# Patient Record
Sex: Female | Born: 1965 | Race: White | Hispanic: No | Smoking: Former smoker
Health system: Southern US, Community
[De-identification: ages and names within clinical notes are randomized; demographics above are authoritative.]

---

## 2011-02-09 DIAGNOSIS — C50919 Malignant neoplasm of unspecified site of unspecified female breast: Secondary | ICD-10-CM

## 2011-02-09 HISTORY — DX: Malignant neoplasm of unspecified site of unspecified female breast: C50.919

## 2015-02-09 DIAGNOSIS — Z87891 Personal history of nicotine dependence: Secondary | ICD-10-CM

## 2015-02-09 HISTORY — DX: Personal history of nicotine dependence: Z87.891

## 2019-12-28 ENCOUNTER — Inpatient Hospital Stay (HOSPITAL_COMMUNITY)
Admission: AD | Admit: 2019-12-28 | Discharge: 2019-12-31 | DRG: 406 | Disposition: A | Discharge status: Home / Self Care | Payer: Medicaid Other | Source: Other Acute Inpatient Hospital | Attending: Internal Medicine | Admitting: Internal Medicine

## 2019-12-28 ENCOUNTER — Encounter (HOSPITAL_COMMUNITY): Payer: Self-pay | Admitting: Family Medicine

## 2019-12-28 ENCOUNTER — Other Ambulatory Visit: Payer: Self-pay

## 2019-12-28 ENCOUNTER — Observation Stay (HOSPITAL_COMMUNITY): Payer: Medicaid Other

## 2019-12-28 DIAGNOSIS — J449 Chronic obstructive pulmonary disease, unspecified: Secondary | ICD-10-CM | POA: Diagnosis not present

## 2019-12-28 DIAGNOSIS — E876 Hypokalemia: Secondary | ICD-10-CM | POA: Diagnosis not present

## 2019-12-28 DIAGNOSIS — K449 Diaphragmatic hernia without obstruction or gangrene: Secondary | ICD-10-CM | POA: Diagnosis present

## 2019-12-28 DIAGNOSIS — R7401 Elevation of levels of liver transaminase levels: Secondary | ICD-10-CM | POA: Diagnosis present

## 2019-12-28 DIAGNOSIS — K8067 Calculus of gallbladder and bile duct with acute and chronic cholecystitis with obstruction: Secondary | ICD-10-CM | POA: Diagnosis not present

## 2019-12-28 DIAGNOSIS — Z87891 Personal history of nicotine dependence: Secondary | ICD-10-CM | POA: Diagnosis not present

## 2019-12-28 DIAGNOSIS — K8050 Calculus of bile duct without cholangitis or cholecystitis without obstruction: Principal | ICD-10-CM

## 2019-12-28 DIAGNOSIS — R7989 Other specified abnormal findings of blood chemistry: Secondary | ICD-10-CM

## 2019-12-28 DIAGNOSIS — I10 Essential (primary) hypertension: Secondary | ICD-10-CM | POA: Diagnosis present

## 2019-12-28 DIAGNOSIS — K2210 Ulcer of esophagus without bleeding: Secondary | ICD-10-CM | POA: Diagnosis present

## 2019-12-28 DIAGNOSIS — K838 Other specified diseases of biliary tract: Secondary | ICD-10-CM

## 2019-12-28 DIAGNOSIS — R933 Abnormal findings on diagnostic imaging of other parts of digestive tract: Secondary | ICD-10-CM

## 2019-12-28 DIAGNOSIS — E871 Hypo-osmolality and hyponatremia: Secondary | ICD-10-CM | POA: Diagnosis not present

## 2019-12-28 DIAGNOSIS — R1319 Other dysphagia: Secondary | ICD-10-CM

## 2019-12-28 DIAGNOSIS — R1010 Upper abdominal pain, unspecified: Secondary | ICD-10-CM

## 2019-12-28 DIAGNOSIS — Z853 Personal history of malignant neoplasm of breast: Secondary | ICD-10-CM

## 2019-12-28 DIAGNOSIS — Z79899 Other long term (current) drug therapy: Secondary | ICD-10-CM

## 2019-12-28 DIAGNOSIS — K222 Esophageal obstruction: Secondary | ICD-10-CM | POA: Diagnosis present

## 2019-12-28 DIAGNOSIS — Z20822 Contact with and (suspected) exposure to covid-19: Secondary | ICD-10-CM | POA: Diagnosis present

## 2019-12-28 DIAGNOSIS — K7581 Nonalcoholic steatohepatitis (NASH): Secondary | ICD-10-CM | POA: Diagnosis not present

## 2019-12-28 DIAGNOSIS — K2100 Gastro-esophageal reflux disease with esophagitis, without bleeding: Secondary | ICD-10-CM

## 2019-12-28 DIAGNOSIS — R935 Abnormal findings on diagnostic imaging of other abdominal regions, including retroperitoneum: Secondary | ICD-10-CM

## 2019-12-28 LAB — COMPREHENSIVE METABOLIC PANEL
ALT: 161 U/L — ABNORMAL HIGH (ref 0–44)
AST: 158 U/L — ABNORMAL HIGH (ref 15–41)
Albumin: 2.6 g/dL — ABNORMAL LOW (ref 3.5–5.0)
Alkaline Phosphatase: 1239 U/L — ABNORMAL HIGH (ref 38–126)
Anion gap: 7 (ref 5–15)
BUN: 6 mg/dL (ref 6–20)
CO2: 25 mmol/L (ref 22–32)
Calcium: 8.6 mg/dL — ABNORMAL LOW (ref 8.9–10.3)
Chloride: 102 mmol/L (ref 98–111)
Creatinine, Ser: 0.41 mg/dL — ABNORMAL LOW (ref 0.44–1.00)
GFR, Estimated: >60 mL/min (ref >60)
Glucose, Bld: 99 mg/dL (ref 70–99)
Potassium: 3.1 mmol/L — ABNORMAL LOW (ref 3.5–5.1)
Sodium: 134 mmol/L — ABNORMAL LOW (ref 135–145)
Total Bilirubin: 1 mg/dL (ref 0.3–1.2)
Total Protein: 5.8 g/dL — ABNORMAL LOW (ref 6.5–8.1)

## 2019-12-28 LAB — CBC WITH DIFFERENTIAL/PLATELET
Abs Immature Granulocytes: 0.05 10*3/uL (ref 0.00–0.07)
Basophils Absolute: 0.1 10*3/uL (ref 0.0–0.1)
Basophils Relative: 1 %
Eosinophils Absolute: 0.1 10*3/uL (ref 0.0–0.5)
Eosinophils Relative: 1 %
HCT: 29.2 % — ABNORMAL LOW (ref 36.0–46.0)
Hemoglobin: 9.6 g/dL — ABNORMAL LOW (ref 12.0–15.0)
Immature Granulocytes: 1 %
Lymphocytes Relative: 10 %
Lymphs Abs: 1 10*3/uL (ref 0.7–4.0)
MCH: 32.9 pg (ref 26.0–34.0)
MCHC: 32.9 g/dL (ref 30.0–36.0)
MCV: 100 fL (ref 80.0–100.0)
Monocytes Absolute: 0.8 10*3/uL (ref 0.1–1.0)
Monocytes Relative: 9 %
Neutro Abs: 7.8 10*3/uL — ABNORMAL HIGH (ref 1.7–7.7)
Neutrophils Relative %: 78 %
Platelets: 372 10*3/uL (ref 150–400)
RBC: 2.92 MIL/uL — ABNORMAL LOW (ref 3.87–5.11)
RDW: 13.6 % (ref 11.5–15.5)
WBC: 9.8 10*3/uL (ref 4.0–10.5)
nRBC: 0 % (ref 0.0–0.2)

## 2019-12-28 LAB — LIPASE, BLOOD: Lipase: 73 U/L — ABNORMAL HIGH (ref 11–51)

## 2019-12-28 LAB — HIV ANTIBODY (ROUTINE TESTING W REFLEX): HIV Screen 4th Generation wRfx: NONREACTIVE

## 2019-12-28 LAB — MAGNESIUM: Magnesium: 1.8 mg/dL (ref 1.7–2.4)

## 2019-12-28 MED ORDER — GADOBUTROL 1 MMOL/ML IV SOLN
5.0000 mL | Freq: Once | INTRAVENOUS | Status: AC | PRN
  Administered 2019-12-28: 5 mL via INTRAVENOUS

## 2019-12-28 MED ORDER — ONDANSETRON HCL 4 MG/2ML IJ SOLN: 4.0000 mg | Freq: Three times a day (TID) | INTRAMUSCULAR | Status: DC | PRN

## 2019-12-28 MED ORDER — ENOXAPARIN SODIUM 40 MG/0.4ML ~~LOC~~ SOLN
40.0000 mg | SUBCUTANEOUS | Status: AC
  Administered 2019-12-28 – 2019-12-29 (×2): 40 mg via SUBCUTANEOUS
  Filled 2019-12-28 (×2): qty 0.4

## 2019-12-28 MED ORDER — UMECLIDINIUM BROMIDE 62.5 MCG/INH IN AEPB
1.0000 | INHALATION_SPRAY | Freq: Every day | RESPIRATORY_TRACT | Status: DC
  Filled 2019-12-28: qty 7

## 2019-12-28 MED ORDER — LACTATED RINGERS IV SOLN: INTRAVENOUS | Status: DC

## 2019-12-28 MED ORDER — LIP MEDEX EX OINT
TOPICAL_OINTMENT | CUTANEOUS | Status: AC
  Filled 2019-12-28: qty 7

## 2019-12-28 MED ORDER — HYDROMORPHONE HCL 1 MG/ML IJ SOLN
0.5000 mg | INTRAMUSCULAR | Status: DC | PRN
  Administered 2019-12-28: 0.5 mg via INTRAVENOUS
  Filled 2019-12-28: qty 0.5

## 2019-12-28 MED ORDER — HYDROMORPHONE HCL 1 MG/ML IJ SOLN
1.0000 mg | INTRAMUSCULAR | Status: DC | PRN
  Administered 2019-12-28 – 2019-12-31 (×14): 1 mg via INTRAVENOUS
  Filled 2019-12-28 (×14): qty 1

## 2019-12-28 MED ORDER — AMLODIPINE BESYLATE 5 MG PO TABS
5.0000 mg | ORAL_TABLET | Freq: Every day | ORAL | Status: DC
  Administered 2019-12-29: 5 mg via ORAL
  Filled 2019-12-28: qty 1

## 2019-12-28 MED ORDER — MOMETASONE FURO-FORMOTEROL FUM 200-5 MCG/ACT IN AERO
2.0000 | INHALATION_SPRAY | Freq: Two times a day (BID) | RESPIRATORY_TRACT | Status: DC
  Filled 2019-12-28: qty 8.8

## 2019-12-28 MED ORDER — POTASSIUM CHLORIDE CRYS ER 20 MEQ PO TBCR
20.0000 meq | EXTENDED_RELEASE_TABLET | Freq: Two times a day (BID) | ORAL | Status: AC
  Administered 2019-12-28: 20 meq via ORAL
  Filled 2019-12-28 (×2): qty 1

## 2019-12-28 MED ORDER — ALBUTEROL SULFATE (2.5 MG/3ML) 0.083% IN NEBU: 2.5000 mg | INHALATION_SOLUTION | Freq: Four times a day (QID) | RESPIRATORY_TRACT | Status: DC | PRN

## 2019-12-28 NOTE — Progress Notes (Signed)
Pt arrived to the via [place] from [facility]. [facility]. Called and updated. Per Secretary: Dr. Lara Mulch was notified and will come to evaluate patient. Awaiting orders for patient. Charge Nurse updated.   CHG bath given and patient oriented to room enviroment. Restricted arm band and name band placed, pt has history of L Breast Ca.  Call light and phone in reach. Awaiting new orders/

## 2019-12-28 NOTE — H&P (Signed)
History and Physical  Patient Name: Katrina Calhoun     ESP:[id number]    DOB: 10/23/1965    DOA: 12/28/2019 PCP: Pcp, No  Patient coming from: [facility]  Chief Complaint: Epigastric pain      HPI: Katrina Calhoun is a 54 y.o. F with hx COPD FEV1 46%, HTN who presents with 1 week progrsesive epigastric pain.  Over last week, patient has had intermittent/colicky, severe epigastric pian, associated with nausea.  Finally came to ER at [facility] where AST/ALT >300, Tbili 2.8, and CT abdomen showed dilated CBD.  Case discussed with GI here who recommended transfer for evaluation for ERCP.       ROS: Review of Systems  Constitutional: Negative for chills, fever and malaise/fatigue.  Gastrointestinal: Positive for abdominal pain and nausea.  All other systems reviewed and are negative.         Past Medical History:  Diagnosis Date  . Breast cancer ([place]) 2013   Left Breast CA  . Smoking history 02/2015   stopped smoking    Past surgical history: None pertinent.     Social History: Patient lives at home.  The patient walks unassisted.  Former smoker.  No Known Allergies  Family history:  Reviewed, no family history of gallbladder disease.  Prior to Admission medications   Medication Sig Start Date End Date Taking? Authorizing Provider  Aclidinium Bromide (TUDORZA PRESSAIR) 400 MCG/ACT AEPB Inhale 400 mcg into the lungs every 12 (twelve) hours. 12/26/18  Yes [provider]  albuterol (VENTOLIN HFA) 108 (90 Base) MCG/ACT inhaler Inhale 2 puffs into the lungs every 6 (six) hours as needed for wheezing. 12/26/18  Yes [provider]  amLODipine (NORVASC) 5 MG tablet Take 5 mg by mouth daily. 10/09/19  Yes [provider]  Fluticasone-Salmeterol (ADVAIR) 250-50 MCG/DOSE AEPB Inhale 1 puff into the lungs every 12 (twelve) hours. 12/26/18  Yes [provider]  Ipratropium-Albuterol (COMBIVENT) 20-100 MCG/ACT AERS respimat Inhale 2  puffs into the lungs every 4 (four) hours as needed for wheezing or shortness of breath.  02/14/18  Yes [provider]       Physical Exam: BP 114/84 (BP Location: Right Arm)   Pulse 80   Temp 97.9 F (36.6 C) (Oral)   Resp 18   Ht _0  (1.6 m)   Wt 52.2 kg   SpO2 99%   BMI 20.37 kg/m  General appearance: Well-developed, adult female, alert and in no acute distress.   Eyes: Anicteric, conjunctiva pink, lids and lashes normal. PERRL.    ENT: No nasal deformity, discharge, epistaxis.  Hearing normal. OP moist without lesions.   Neck: No neck masses.  Trachea midline.  No thyromegaly/tenderness. Lymph: No cervical or supraclavicular lymphadenopathy. Skin: Warm and dry.  No jaundice.  No suspicious rashes or lesions. Cardiac: RRR, nl S1-S2, no murmurs appreciated.  Capillary refill is brisk.  JVP normal.  No LE edema.  Radial pulses 2+ and symmetric. Respiratory: Normal respiratory rate and rhythm.  CTAB without rales or wheezes. Abdomen: Abdomen soft.  Moderate epigastric TTP with voluntary guarding. No ascites, distension, hepatosplenomegaly.   MSK: No deformities or effusions of the large joints of the upper or lower extremities bilaterally.  No cyanosis or clubbing. Neuro: Cranial nerves normal.  Sensation intact to light touch. Speech is fluent.  Muscle strength normal.    Psych: Sensorium intact and responding to questions, attention normal.  Behavior appropriate.  Affect normal.  Judgment and insight appear normal.  Labs on Admission:  I have personally reviewed following labs and imaging studies: CBC: Recent Labs  Lab 12/28/19 1033  WBC 9.8  NEUTROABS 7.8*  HGB 9.6*  HCT 29.2*  MCV 100.0  PLT 718   Basic Metabolic Panel: Recent Labs  Lab 12/28/19 1033  NA 134*  K 3.1*  CL 102  CO2 25  GLUCOSE 99  BUN 6  CREATININE 0.41*  CALCIUM 8.6*   GFR: Estimated Creatinine Clearance: 64.7 mL/min (A) (by C-G formula based on SCr of 0.41 mg/dL  (L)).  Liver Function Tests: Recent Labs  Lab 12/28/19 1033  AST 158*  ALT 161*  ALKPHOS 1,239*  BILITOT 1.0  PROT 5.8*  ALBUMIN 2.6*       Assessment/Plan   Transaminitis and choledocholithiasis Patient presented to OSH with AST/ALT reportedly in 300s and Tbili 2.8, down to 100s and normal respectively today but Alk phos >1000.   CT noted CBD dilation at OSH.  -NPO except ice chips -Trend LFTs and lipase  -Consult GI for advice re: MRCP and observation vs ERCP    COPD FEV1 46%, not on O2.  No active disease.  With regard to risks of sedation given respiratory disease, she is well compensated presently, has no symptoms, and is actually feeling better respiratory wise since quitting smoking 4 months ago.  She has marginally increased risk of sedation due to her COPD, but needs no further optimization prior to ERCP if needed  -Continue ICS/LABA/LAMA    Hypokalemia -Check mag -Supplement K  Hypertension  -Continue home amlodipine      DVT prophylaxis: Lovenox  Code Status: FULL  Family Communication: None  Disposition Plan: Anticipate GI evaluation and observation overnight, if no intervention planned and LFTs trend down, possibly home tomorrow. Consults called: GI Admission status: OBS   At the point of initial evaluation, it is my clinical opinion that admission for OBSERVATION is reasonable and necessary because the patient's presenting complaints in the context of their chronic conditions represent sufficient risk of deterioration or significant morbidity to constitute reasonable grounds for close observation in the hospital setting, but that the patient may be medically stable for discharge from the hospital within 24 to 48 hours.    Medical decision making: Patient seen at 8:40AM on 12/28/2019.  The patient was discussed with Dr. Cristina Gong who will disuss with [facility].  What exists of the patient's chart was reviewed in depth and summarized above.   Clinical condition: stable.        [place] [facility] Please page though [place] or Epic secure chat:  For password, contact charge nurse

## 2019-12-28 NOTE — Progress Notes (Signed)
Initial Nutrition Assessment  RD working remotely.  DOCUMENTATION CODES:   Not applicable  INTERVENTION:  - diet advancement as medically feasible. - will complete NFPE at follow-up.   NUTRITION DIAGNOSIS:   Inadequate oral intake related to acute illness as evidenced by per patient/family report.  GOAL:   Patient will meet greater than or equal to 90% of their needs  MONITOR:   Diet advancement, Labs, Weight trends  REASON FOR ASSESSMENT:   Malnutrition Screening Tool    ASSESSMENT:   54 y.o. female with medical history of COPD, HTN, and breast cancer. She presented to the ED with 1-week hx of epigastric pain which was intermittent, severe, and associated with nausea.  Patient has been NPO since admission. Weight today is documented as 115 lb, which appears to be a stated weight. The only other weight recorded in the chart was from [place] in 05/2015 when she weighed 121 lb.   Per notes: - transaminitis with choledocholithiasis - GI consulted and recommends MRI/MRCP and possible ERCP dependent on results   Labs reviewed; Na: 134 mmol/l, K: 3.1 mmol/l, creatinine: 0.41 mg/dl, Ca: 8.6 mg/dl, Alk Phos and LFTs significantly elevated.  Medications reviewed; 20 mEq Klor-Con x2 doses 11/19. IVF; LR @ 100 ml/hr.    NUTRITION - FOCUSED PHYSICAL EXAM:  unable to complete at this time.   Diet Order:   Diet Order            Diet NPO time specified Except for: Ice Chips, Sips with Meds  Diet effective now                 EDUCATION NEEDS:   No education needs have been identified at this time  Skin:  Skin Assessment: Reviewed RN Assessment  Last BM:  PTA/unknown  Height:   Ht Readings from Last 1 Encounters:  12/28/19 $RemoveB'5\' 3"'LnhoLeNy$  (1.6 m)    Weight:   Wt Readings from Last 1 Encounters:  12/28/19 52.2 kg     Estimated Nutritional Needs:  Kcal:  1570-1825 kcal Protein:  75-85 grams Fluid:  >/= 1.6 L/day     Jarome Matin, MS, RD, LDN, CNSC Inpatient  Clinical Dietitian RD pager # available in [facility]  After hours/weekend pager # available in [facility]

## 2019-12-28 NOTE — Consult Note (Addendum)
Referring Provider:  [facility] Primary Care Physician:  Pcp, No Primary Gastroenterologist:  Althia Forts  Reason for Consultation:  Elevated LFTs, dilated CBD on CT scan  HPI: Katrina Calhoun is a 54 y.o. female with limited [place] who presented to the hospital at [facility] with complaints of upper abdominal pain over the past week.  Pain is severe.  Had one episode of vomiting on Monday and associated nausea.  Says that she's never had pain like this in the past and says that she has a "stomach of steel" and usually has no GI issues.  Finally went to ER at [facility] where AST/ALT >300, Tbili 2.88, ALP 2400.  CT abdomen and pelvis with contrast showed multiple gallstones, dilated CBD.    Here total bili is normal at 1.0, ALP is down to 1239, AST 158, and ALT 161.  She is still having pain, but it is controlled with pain medication.  Past Medical History:  Diagnosis Date  . Breast cancer ([place]) 2013   Left Breast CA  . Smoking history 02/2015   stopped smoking    History reviewed. No pertinent surgical history.  Prior to Admission medications   Medication Sig Start Date End Date Taking? Authorizing Provider  Aclidinium Bromide (TUDORZA PRESSAIR) 400 MCG/ACT AEPB Inhale 400 mcg into the lungs every 12 (twelve) hours. 12/26/18  Yes [provider]  albuterol (VENTOLIN HFA) 108 (90 Base) MCG/ACT inhaler Inhale 2 puffs into the lungs every 6 (six) hours as needed for wheezing. 12/26/18  Yes [provider]  amLODipine (NORVASC) 5 MG tablet Take 5 mg by mouth daily. 10/09/19  Yes [provider]  Fluticasone-Salmeterol (ADVAIR) 250-50 MCG/DOSE AEPB Inhale 1 puff into the lungs every 12 (twelve) hours. 12/26/18  Yes [provider]  Ipratropium-Albuterol (COMBIVENT) 20-100 MCG/ACT AERS respimat Inhale 2 puffs into the lungs every 4 (four) hours as needed for wheezing or shortness of breath.  02/14/18  Yes [provider]    Current  Facility-Administered Medications  Medication Dose Route Frequency Provider Last Rate Last Admin  . albuterol (PROVENTIL) (2.5 MG/3ML) 0.083% nebulizer solution 2.5 mg  2.5 mg Nebulization Q6H PRN Danford, Suann Larry, MD      . amLODipine (NORVASC) tablet 5 mg  5 mg Oral Daily Danford, Christopher P, MD      . enoxaparin (LOVENOX) injection 40 mg  40 mg Subcutaneous Q24H Danford, Suann Larry, MD   40 mg at 12/28/19 1002  . HYDROmorphone (DILAUDID) injection 1 mg  1 mg Intravenous Q4H PRN Edwin Dada, MD   1 mg at 12/28/19 1103  . lactated ringers infusion   Intravenous Continuous Edwin Dada, MD 100 mL/hr at 12/28/19 0741 New Bag at 12/28/19 0741  . lip balm (CARMEX) ointment           . mometasone-formoterol (DULERA) 200-5 MCG/ACT inhaler 2 puff  2 puff Inhalation BID Danford, Suann Larry, MD      . ondansetron (ZOFRAN) injection 4 mg  4 mg Intravenous Q8H PRN Danford, Suann Larry, MD      . potassium chloride SA (KLOR-CON) CR tablet 20 mEq  20 mEq Oral BID Danford, Christopher P, MD      . umeclidinium bromide (INCRUSE ELLIPTA) 62.5 MCG/INH 1 puff  1 puff Inhalation Daily Danford, Suann Larry, MD        Allergies as of 12/27/2019  . (Not on File)    History reviewed. No pertinent family history.  Social History   Socioeconomic History  .  Marital status: Significant Other    Spouse name: Not on file  . Number of children: Not on file  . Years of education: Not on file  . Highest education level: Not on file  Occupational History  . Not on file  Tobacco Use  . Smoking status: Former Research scientist (life sciences)  . Smokeless tobacco: Never Used  Substance and Sexual Activity  . Alcohol use: Not on file  . Drug use: Not on file  . Sexual activity: Not on file  Other Topics Concern  . Not on file  Social History Narrative  . Not on file   Social Determinants of Health   Financial Resource Strain:   . Difficulty of Paying Living Expenses: Not on file  Food  Insecurity:   . Worried About Charity fundraiser in the Last Year: Not on file  . Ran Out of Food in the Last Year: Not on file  Transportation Needs:   . Lack of Transportation (Medical): Not on file  . Lack of Transportation (Non-Medical): Not on file  Physical Activity:   . Days of Exercise per Week: Not on file  . Minutes of Exercise per Session: Not on file  Stress:   . Feeling of Stress : Not on file  Social Connections:   . Frequency of Communication with Friends and Family: Not on file  . Frequency of Social Gatherings with Friends and Family: Not on file  . Attends Religious Services: Not on file  . Active Member of Clubs or Organizations: Not on file  . Attends Archivist Meetings: Not on file  . Marital Status: Not on file  Intimate Partner Violence:   . Fear of Current or Ex-Partner: Not on file  . Emotionally Abused: Not on file  . Physically Abused: Not on file  . Sexually Abused: Not on file    Review of Systems: ROS is O/W negative except as mentioned in HPI.  Physical Exam: Vital signs in last 24 hours: Temp:  [97.9 F (36.6 C)] 97.9 F (36.6 C) (11/19 0546) Pulse Rate:  [80-97] 80 (11/19 0959) Resp:  [16-18] 18 (11/19 0959) BP: (114-142)/(82-84) 114/84 (11/19 0959) SpO2:  [99 %-100 %] 99 % (11/19 0959) Weight:  [52.2 kg] 52.2 kg (11/19 0500) Last BM Date: 12/25/19 General:  Alert,  ell-developed, well-nourished, pleasant and cooperative in NAD Head:  Normocephalic and atraumatic. Eyes:  Sclera clear, no icterus.  Conjunctiva pink. Ears:  Normal auditory acuity. Mouth:  No deformity or lesions.   Lungs:  Clear throughout to auscultation.  No wheezes, crackles, or rhonchi.  Heart:  Regular rate and rhythm; no murmurs, clicks, rubs, or gallops. Abdomen:  Soft, non-distended.  BS present.  Epigastric TTP.   Msk:  Symmetrical without gross deformities. Pulses:  Normal pulses noted. Extremities:  Without clubbing or edema. Neurologic:  Alert  and oriented x 4;  grossly normal neurologically. Skin:  Intact without significant lesions or rashes. Psych:  Alert and cooperative. Normal mood and affect.  Lab Results: Recent Labs    12/28/19 1033  WBC 9.8  HGB 9.6*  HCT 29.2*  PLT 372   BMET Recent Labs    12/28/19 1033  NA 134*  K 3.1*  CL 102  CO2 25  GLUCOSE 99  BUN 6  CREATININE 0.41*  CALCIUM 8.6*   LFT Recent Labs    12/28/19 1033  PROT 5.8*  ALBUMIN 2.6*  AST 158*  ALT 161*  ALKPHOS 1,239*  BILITOT 1.0   IMPRESSION:  *  Epigastric abdominal pain x 1 week with associated elevated LFTs and CT scan showing dilated CBD.  Now with normal total bili and elevated AST, ALT, and ALP but much lower than at OSH.  ? If she passed the gallstone but she still has quite a bit of pain. *Hypokalemia:  K+ 3.1  PLAN: -MRI/MRCP ordered STAT to determine if CBD stone present.  If positive then will need ERCP. -K+ replacement per hospitalist.   Laban Emperor. Zehr  12/28/2019, 1:01 PM  GI ATTENDING  History, laboratories, x-ray report reviewed.  Agree with comprehensive consultation note as outlined above.  Patient with abdominal pain and biliary dilation.  Markedly elevated alkaline phosphatase with normal bilirubin.  Suspect stones but disproportionate elevation of alkaline phosphatase is curious.  Would proceed with MRCP to outline anatomy.  Will likely need ERCP either way.  Hypokalemia correction per the hospitalist.  Will follow.  Docia Chuck. Geri Seminole., M.D. [facility] Division of Gastroenterology

## 2019-12-29 DIAGNOSIS — R7401 Elevation of levels of liver transaminase levels: Secondary | ICD-10-CM | POA: Diagnosis not present

## 2019-12-29 DIAGNOSIS — K8050 Calculus of bile duct without cholangitis or cholecystitis without obstruction: Secondary | ICD-10-CM | POA: Diagnosis not present

## 2019-12-29 DIAGNOSIS — K831 Obstruction of bile duct: Secondary | ICD-10-CM | POA: Diagnosis not present

## 2019-12-29 DIAGNOSIS — I10 Essential (primary) hypertension: Secondary | ICD-10-CM

## 2019-12-29 DIAGNOSIS — R933 Abnormal findings on diagnostic imaging of other parts of digestive tract: Secondary | ICD-10-CM | POA: Diagnosis not present

## 2019-12-29 DIAGNOSIS — J449 Chronic obstructive pulmonary disease, unspecified: Secondary | ICD-10-CM

## 2019-12-29 DIAGNOSIS — K838 Other specified diseases of biliary tract: Secondary | ICD-10-CM | POA: Diagnosis not present

## 2019-12-29 LAB — COMPREHENSIVE METABOLIC PANEL
ALT: 107 U/L — ABNORMAL HIGH (ref 0–44)
AST: 59 U/L — ABNORMAL HIGH (ref 15–41)
Albumin: 2.5 g/dL — ABNORMAL LOW (ref 3.5–5.0)
Alkaline Phosphatase: 953 U/L — ABNORMAL HIGH (ref 38–126)
Anion gap: 11 (ref 5–15)
BUN: 6 mg/dL (ref 6–20)
CO2: 22 mmol/L (ref 22–32)
Calcium: 8.4 mg/dL — ABNORMAL LOW (ref 8.9–10.3)
Chloride: 100 mmol/L (ref 98–111)
Creatinine, Ser: 0.4 mg/dL — ABNORMAL LOW (ref 0.44–1.00)
GFR, Estimated: >60 mL/min (ref >60)
Glucose, Bld: 82 mg/dL (ref 70–99)
Potassium: 3 mmol/L — ABNORMAL LOW (ref 3.5–5.1)
Sodium: 133 mmol/L — ABNORMAL LOW (ref 135–145)
Total Bilirubin: 1.1 mg/dL (ref 0.3–1.2)
Total Protein: 5.4 g/dL — ABNORMAL LOW (ref 6.5–8.1)

## 2019-12-29 LAB — RESP PANEL BY RT-PCR (FLU A&B, COVID) ARPGX2
Influenza A by PCR: NEGATIVE
Influenza B by PCR: NEGATIVE
SARS Coronavirus 2 by RT PCR: NEGATIVE

## 2019-12-29 LAB — LIPASE, BLOOD: Lipase: 54 U/L — ABNORMAL HIGH (ref 11–51)

## 2019-12-29 MED ORDER — SODIUM CHLORIDE 0.9 % IV SOLN: INTRAVENOUS | Status: DC

## 2019-12-29 MED ORDER — POTASSIUM CHLORIDE CRYS ER 20 MEQ PO TBCR
40.0000 meq | EXTENDED_RELEASE_TABLET | Freq: Once | ORAL | Status: AC
  Administered 2019-12-29: 40 meq via ORAL
  Filled 2019-12-29: qty 2

## 2019-12-29 MED ORDER — PIPERACILLIN-TAZOBACTAM 3.375 G IVPB
3.3750 g | Freq: Three times a day (TID) | INTRAVENOUS | Status: DC
  Administered 2019-12-29 – 2019-12-31 (×7): 3.375 g via INTRAVENOUS
  Filled 2019-12-29 (×8): qty 50

## 2019-12-29 NOTE — Progress Notes (Signed)
PHARMACY NOTE -  Katrina Calhoun has been assisting with dosing of Zosyn for IAI.  Dosage remains stable at 3.375 g IV q8 hr and need for further dosage adjustment appears unlikely at present given baseline renal function  Pharmacy will sign off, following peripherally for culture results or dose adjustments. Please reconsult if a change in clinical status warrants re-evaluation of dosage.  [name], PharmD, BCPS [phone] 12/29/2019, 1:55 PM

## 2019-12-29 NOTE — H&P (View-Only) (Signed)
     [facility] Progress Note  CC:  Dilated CBD; elevated LFTs  Subjective:  Still with epigastric pain.  No vomiting.  Wants to try to something to eat or drink.  Objective:  Vital signs in last 24 hours: Temp:  [98 F (36.7 C)-99.6 F (37.6 C)] 99.1 F (37.3 C) (11/20 0733) Pulse Rate:  [71-100] 77 (11/20 0733) Resp:  [14-18] 16 (11/20 0733) BP: (114-154)/(69-91) 117/69 (11/20 0733) SpO2:  [97 %-99 %] 97 % (11/20 0733) Last BM Date: 12/24/19 General:  Alert, Well-developed, in NAD Heart:  Regular rate and rhythm; no murmurs Pulm:  CTAB.  No W/R/R. Abdomen:  Soft, non-distended.  BS present.  Epigastric TTP. Extremities:  Without edema. Neurologic:  Alert and oriented x 4;  grossly normal neurologically. Psych:  Alert and cooperative. Normal mood and affect.  Intake/Output from previous day: 11/19 0701 - 11/20 0700 In: 1560 [P.O.:360; I.V.:1200] Out: -   Lab Results: Recent Labs    12/28/19 1033  WBC 9.8  HGB 9.6*  HCT 29.2*  PLT 372   BMET Recent Labs    12/28/19 1033 12/29/19 0329  NA 134* 133*  K 3.1* 3.0*  CL 102 100  CO2 25 22  GLUCOSE 99 82  BUN 6 6  CREATININE 0.41* 0.40*  CALCIUM 8.6* 8.4*   LFT Recent Labs    12/29/19 0329  PROT 5.4*  ALBUMIN 2.5*  AST 59*  ALT 107*  ALKPHOS 953*  BILITOT 1.1   Assessment / Plan: *Epigastric abdominal pain x 1 week with associated elevated LFTs and CT scan showing dilated CBD.  Now with normal total bili and elevated AST, ALT, and ALP but much lower than at OSH.  LFTs continue to trend down.  MRCP shows CBD stone and duct dilation. *Hypokalemia:  K+ 3.0  -ERCP planned for 11/21.  NPO after midnight.  Ok for lovenox this AM but will hold dose for tomorrow. -K+ replacement per hospitalist.   LOS: 1 day   Laban Emperor. Zehr  12/29/2019, 8:54 AM  GI ATTENDING  Interval history data reviewed.  MRCP reviewed.  Patient personally seen and examined.  Still having some abdominal discomfort.   Tolerating her diet.  Looks comfortable at bedside.  Significant biliary dilation.  Said to have a small distal stone.  Difficult for me to ascertain on the examination.  No other abnormality noted.  She is for ERCP with possible sphincterotomy and stone extraction tomorrow.The nature of the procedure, as well as the risks (pancreatitis, bleeding, perforation, infection), benefits, and alternatives were carefully and thoroughly reviewed with the patient. Ample time for discussion and questions allowed. The patient understood, was satisfied, and agreed to proceed.  Docia Chuck. Geri Seminole., M.D. [facility] Division of Gastroenterology

## 2019-12-29 NOTE — Progress Notes (Addendum)
     [facility] Progress Note  CC:  Dilated CBD; elevated LFTs  Subjective:  Still with epigastric pain.  No vomiting.  Wants to try to something to eat or drink.  Objective:  Vital signs in last 24 hours: Temp:  [98 F (36.7 C)-99.6 F (37.6 C)] 99.1 F (37.3 C) (11/20 0733) Pulse Rate:  [71-100] 77 (11/20 0733) Resp:  [14-18] 16 (11/20 0733) BP: (114-154)/(69-91) 117/69 (11/20 0733) SpO2:  [97 %-99 %] 97 % (11/20 0733) Last BM Date: 12/24/19 General:  Alert, Well-developed, in NAD Heart:  Regular rate and rhythm; no murmurs Pulm:  CTAB.  No W/R/R. Abdomen:  Soft, non-distended.  BS present.  Epigastric TTP. Extremities:  Without edema. Neurologic:  Alert and oriented x 4;  grossly normal neurologically. Psych:  Alert and cooperative. Normal mood and affect.  Intake/Output from previous day: 11/19 0701 - 11/20 0700 In: 1560 [P.O.:360; I.V.:1200] Out: -   Lab Results: Recent Labs    12/28/19 1033  WBC 9.8  HGB 9.6*  HCT 29.2*  PLT 372   BMET Recent Labs    12/28/19 1033 12/29/19 0329  NA 134* 133*  K 3.1* 3.0*  CL 102 100  CO2 25 22  GLUCOSE 99 82  BUN 6 6  CREATININE 0.41* 0.40*  CALCIUM 8.6* 8.4*   LFT Recent Labs    12/29/19 0329  PROT 5.4*  ALBUMIN 2.5*  AST 59*  ALT 107*  ALKPHOS 953*  BILITOT 1.1   Assessment / Plan: *Epigastric abdominal pain x 1 week with associated elevated LFTs and CT scan showing dilated CBD.  Now with normal total bili and elevated AST, ALT, and ALP but much lower than at OSH.  LFTs continue to trend down.  MRCP shows CBD stone and duct dilation. *Hypokalemia:  K+ 3.0  -ERCP planned for 11/21.  NPO after midnight.  Ok for lovenox this AM but will hold dose for tomorrow. -K+ replacement per hospitalist.   LOS: 1 day   Laban Emperor. Zehr  12/29/2019, 8:54 AM  GI ATTENDING  Interval history data reviewed.  MRCP reviewed.  Patient personally seen and examined.  Still having some abdominal discomfort.   Tolerating her diet.  Looks comfortable at bedside.  Significant biliary dilation.  Said to have a small distal stone.  Difficult for me to ascertain on the examination.  No other abnormality noted.  She is for ERCP with possible sphincterotomy and stone extraction tomorrow.The nature of the procedure, as well as the risks (pancreatitis, bleeding, perforation, infection), benefits, and alternatives were carefully and thoroughly reviewed with the patient. Ample time for discussion and questions allowed. The patient understood, was satisfied, and agreed to proceed.  Docia Chuck. Geri Seminole., M.D. [facility] Division of Gastroenterology

## 2019-12-29 NOTE — Progress Notes (Signed)
[facility]                                                                              Patient Demographics  Katrina Calhoun, is a 54 y.o. female, DOB - 06-23-1965, ZOX:[id number]  Admit date - 12/28/2019   Admitting Physician Edwin Dada, MD  Outpatient Primary MD for the patient is Pcp, No  Outpatient specialists:   LOS - 1  days   Medical records reviewed and are as summarized below:    No chief complaint on file.      Brief summary   Patient is a 54 year old female with history of COPD, hypertension presented with 1 week of progressive epigastric pain.  Over the last 1 week, she had intermittent colicky, severe epigastric pain with nausea. She initially went to ER at [facility] where AST/ALT> 300, total bilirubin 2.8 and CT abdomen showed dilated CBD.  Patient was transferred to [facility] for GI evaluation and ERCP  Assessment & Plan    Principal Problem:   Choledocholithiasis with elevated transaminitis -MRCP showed distal CBD stone with moderate extrahepatic ductal dilation, cholelithiasis without evidence of cholecystitis -Alk phos 1239 on admission, AST 158, ALT 161, total bili 1.0, lipase 73 -LFTs improving, still in significant pain 5/10, GI consulted, currently on full liquid diet with n.p.o. after midnight for ERCP -General surgery consulted, discussed with Dr. Georgette Dover, may need laparoscopic cholecystectomy after ERCP   Active Problems:   COPD with chronic bronchitis ([place]) -Stable, no active wheezing, states that she quit smoking 4 months ago -Continue Dulera, Incruse Ellipta, albuterol nebs as needed  Hypokalemia -Replaced p.o.  Hyponatremia -Likely due to poor p.o. intake, continue IV fluid hydration, change to normal saline  Essential hypertension BP currently stable, continue Norvasc  Code Status: Full CODE STATUS DVT Prophylaxis: SCDs Family Communication: Discussed all imaging results, lab results, explained to the  patient's Regan Rakers on the phone.  I was unable to contact patient's significant other and voicemail is not set up   Disposition Plan:     Status is: Observation  The patient will require care spanning > 2 midnights and should be moved to inpatient because: Inpatient level of care appropriate due to severity of illness  Dispo: The patient is from: Home              Anticipated d/c is to: Home              Anticipated d/c date is: 2 days              Patient currently is not medically stable to d/c.     Time Spent in minutes  35 mins   Procedures:  MRCP  Consultants:   General surgery Gastroenterology  Antimicrobials:   Anti-infectives (From admission, onward)   None          Medications  Scheduled Meds: . amLODipine  5 mg Oral Daily  . mometasone-formoterol  2 puff Inhalation BID  . umeclidinium bromide  1 puff Inhalation Daily   Continuous Infusions: . lactated ringers 100 mL/hr at 12/29/19 1020   PRN Meds:.albuterol, HYDROmorphone (DILAUDID) injection, ondansetron (ZOFRAN) IV  Subjective:   Katrina Calhoun was seen and examined today.  Still in significant pain epigastric and right upper quadrant, 5/10, states she did have pain after eating the Jell-O.  No active vomiting.  No fevers or chills. Patient denies dizziness, chest pain, shortness of breath.  No acute events overnight.  Objective:   Vitals:   12/28/19 2207 12/29/19 0217 12/29/19 0617 12/29/19 0733  BP: 126/71 124/72 (!) 131/91 117/69  Pulse: 100 71 100 77  Resp: $Remo'14 14 16 16  'pzvdt$ Temp: 98.5 F (36.9 C) 99.6 F (37.6 C) 99.5 F (37.5 C) 99.1 F (37.3 C)  TempSrc: Oral Oral Oral Oral  SpO2: 98% 97% 99% 97%  Weight:      Height:        Intake/Output Summary (Last 24 hours) at 12/29/2019 1132 Last data filed at 12/29/2019 1000 Gross per 24 hour  Intake 1960 ml  Output --  Net 1960 ml     Wt Readings from Last 3 Encounters:  12/28/19 52.2 kg     Exam  General: Alert and  oriented x 3, NAD  Cardiovascular: S1 S2 auscultated, no murmurs, RRR  Respiratory: Clear to auscultation bilaterally, no wheezing, rales or rhonchi  Gastrointestinal: Soft, epigastric and right upper quadrant TTP, ND, + bowel sounds  Ext: no pedal edema bilaterally  Neuro: no neuro deficits  Musculoskeletal: No digital cyanosis, clubbing  Skin: No rashes  Psych: Normal affect and demeanor, alert and oriented x3    Data Reviewed:  I have personally reviewed following labs and imaging studies  Micro Results No results found for this or any previous visit (from the past 240 hour(s)).  Radiology Reports MR 3D Recon At Scanner  Result Date: 12/29/2019 CLINICAL DATA:  Abdominal pain. Elevated bilirubin. Evaluate for biliary obstruction EXAM: MRI ABDOMEN WITHOUT AND WITH CONTRAST (INCLUDING MRCP) TECHNIQUE: Multiplanar multisequence MR imaging of the abdomen was performed both before and after the administration of intravenous contrast. Heavily T2-weighted images of the biliary and pancreatic ducts were obtained, and three-dimensional MRCP images were rendered by post processing. CONTRAST:  1mL GADAVIST GADOBUTROL 1 MMOL/ML IV SOLN COMPARISON:  None. FINDINGS: Lower chest: Lung bases are clear. Hepatobiliary: No intrahepatic biliary duct dilatation. The common hepatic duct measures 10 mm. The common bile duct measures 12 mm. There is a filling defect within distal common bile duct measuring 3 mm on image 22/series 3. The distal common bile duct stone is also well seen on the MR P sequences (series 13) There multiple gallstones within the gallbladder. No gallbladder distension. Pancreas: No pancreatic duct dilatation. No significant peripancreatic inflammation. The distal common bile duct stone is well seen on coronal image 15/5) Spleen: Normal spleen Adrenals/urinary tract: Adrenal glands and kidneys normal. Stomach/Bowel: Stomach, small bowel, appendix, and cecum are normal. The colon and  rectosigmoid colon are normal. Vascular/Lymphatic: Abdominal aorta is normal caliber. No periportal or retroperitoneal adenopathy. No pelvic adenopathy. Other: No free fluid. Musculoskeletal: No aggressive osseous lesion. IMPRESSION: 1. Distal common bile duct stone (choledocholithiasis) with moderate extrahepatic duct dilatation. 2. Cholelithiasis without evidence of acute cholecystitis. 3. No pancreatic duct dilatation or peripancreatic inflammation. Findings conveyed to Dr. Kennon Holter by Dr. Derrel Nip on 11 19 2021 at 6:30 p.m. Electronically Signed   By: Suzy Bouchard M.D.   On: 12/29/2019 09:57   MR ABDOMEN MRCP W WO CONTAST  Result Date: 12/29/2019 CLINICAL DATA:  Abdominal pain. Elevated bilirubin. Evaluate for biliary obstruction EXAM: MRI ABDOMEN WITHOUT AND WITH CONTRAST (INCLUDING MRCP) TECHNIQUE: Multiplanar multisequence  MR imaging of the abdomen was performed both before and after the administration of intravenous contrast. Heavily T2-weighted images of the biliary and pancreatic ducts were obtained, and three-dimensional MRCP images were rendered by post processing. CONTRAST:  67mL GADAVIST GADOBUTROL 1 MMOL/ML IV SOLN COMPARISON:  None. FINDINGS: Lower chest: Lung bases are clear. Hepatobiliary: No intrahepatic biliary duct dilatation. The common hepatic duct measures 10 mm. The common bile duct measures 12 mm. There is a filling defect within distal common bile duct measuring 3 mm on image 22/series 3. The distal common bile duct stone is also well seen on the MR P sequences (series 13) There multiple gallstones within the gallbladder. No gallbladder distension. Pancreas: No pancreatic duct dilatation. No significant peripancreatic inflammation. The distal common bile duct stone is well seen on coronal image 15/5) Spleen: Normal spleen Adrenals/urinary tract: Adrenal glands and kidneys normal. Stomach/Bowel: Stomach, small bowel, appendix, and cecum are normal. The colon and rectosigmoid colon are  normal. Vascular/Lymphatic: Abdominal aorta is normal caliber. No periportal or retroperitoneal adenopathy. No pelvic adenopathy. Other: No free fluid. Musculoskeletal: No aggressive osseous lesion. IMPRESSION: 1. Distal common bile duct stone (choledocholithiasis) with moderate extrahepatic duct dilatation. 2. Cholelithiasis without evidence of acute cholecystitis. 3. No pancreatic duct dilatation or peripancreatic inflammation. Findings conveyed to Dr. Kennon Holter by Dr. Derrel Nip on 11 19 2021 at 6:30 p.m. Electronically Signed   By: Suzy Bouchard M.D.   On: 12/29/2019 09:57    Lab Data:  CBC: Recent Labs  Lab 12/28/19 1033  WBC 9.8  NEUTROABS 7.8*  HGB 9.6*  HCT 29.2*  MCV 100.0  PLT 299   Basic Metabolic Panel: Recent Labs  Lab 12/28/19 1033 12/29/19 0329  NA 134* 133*  K 3.1* 3.0*  CL 102 100  CO2 25 22  GLUCOSE 99 82  BUN 6 6  CREATININE 0.41* 0.40*  CALCIUM 8.6* 8.4*  MG 1.8  --    GFR: Estimated Creatinine Clearance: 64.7 mL/min (A) (by C-G formula based on SCr of 0.4 mg/dL (L)). Liver Function Tests: Recent Labs  Lab 12/28/19 1033 12/29/19 0329  AST 158* 59*  ALT 161* 107*  ALKPHOS 1,239* 953*  BILITOT 1.0 1.1  PROT 5.8* 5.4*  ALBUMIN 2.6* 2.5*   Recent Labs  Lab 12/28/19 1033 12/29/19 0329  LIPASE 73* 54*   No results for input(s): AMMONIA in the last 168 hours. Coagulation Profile: No results for input(s): INR, PROTIME in the last 168 hours. Cardiac Enzymes: No results for input(s): CKTOTAL, CKMB, CKMBINDEX, TROPONINI in the last 168 hours. BNP (last 3 results) No results for input(s): PROBNP in the last 8760 hours. HbA1C: No results for input(s): HGBA1C in the last 72 hours. CBG: No results for input(s): GLUCAP in the last 168 hours. Lipid Profile: No results for input(s): CHOL, HDL, LDLCALC, TRIG, CHOLHDL, LDLDIRECT in the last 72 hours. Thyroid Function Tests: No results for input(s): TSH, T4TOTAL, FREET4, T3FREE, THYROIDAB in the last 72  hours. Anemia Panel: No results for input(s): VITAMINB12, FOLATE, FERRITIN, TIBC, IRON, RETICCTPCT in the last 72 hours. Urine analysis: No results found for: COLORURINE, APPEARANCEUR, LABSPEC, PHURINE, GLUCOSEU, HGBUR, BILIRUBINUR, KETONESUR, PROTEINUR, UROBILINOGEN, NITRITE, LEUKOCYTESUR   Latonya Nelon M.D. [facility] 12/29/2019, 11:32 AM   Call night coverage person covering after 7pm

## 2019-12-29 NOTE — Plan of Care (Signed)

## 2019-12-30 ENCOUNTER — Encounter (HOSPITAL_COMMUNITY): Payer: Self-pay | Admitting: Family Medicine

## 2019-12-30 ENCOUNTER — Observation Stay (HOSPITAL_COMMUNITY): Payer: Medicaid Other | Admitting: Anesthesiology

## 2019-12-30 ENCOUNTER — Encounter (HOSPITAL_COMMUNITY)
Admission: AD | Disposition: A | Discharge status: Home / Self Care | Payer: Self-pay | Source: Other Acute Inpatient Hospital | Attending: Internal Medicine

## 2019-12-30 ENCOUNTER — Observation Stay (HOSPITAL_COMMUNITY): Payer: Medicaid Other

## 2019-12-30 DIAGNOSIS — K222 Esophageal obstruction: Secondary | ICD-10-CM | POA: Diagnosis present

## 2019-12-30 DIAGNOSIS — Z79899 Other long term (current) drug therapy: Secondary | ICD-10-CM | POA: Diagnosis not present

## 2019-12-30 DIAGNOSIS — I10 Essential (primary) hypertension: Secondary | ICD-10-CM | POA: Diagnosis present

## 2019-12-30 DIAGNOSIS — R1319 Other dysphagia: Secondary | ICD-10-CM

## 2019-12-30 DIAGNOSIS — Z853 Personal history of malignant neoplasm of breast: Secondary | ICD-10-CM | POA: Diagnosis not present

## 2019-12-30 DIAGNOSIS — R933 Abnormal findings on diagnostic imaging of other parts of digestive tract: Secondary | ICD-10-CM | POA: Diagnosis not present

## 2019-12-30 DIAGNOSIS — Z20822 Contact with and (suspected) exposure to covid-19: Secondary | ICD-10-CM | POA: Diagnosis present

## 2019-12-30 DIAGNOSIS — K449 Diaphragmatic hernia without obstruction or gangrene: Secondary | ICD-10-CM | POA: Diagnosis present

## 2019-12-30 DIAGNOSIS — K8050 Calculus of bile duct without cholangitis or cholecystitis without obstruction: Secondary | ICD-10-CM | POA: Diagnosis not present

## 2019-12-30 DIAGNOSIS — E871 Hypo-osmolality and hyponatremia: Secondary | ICD-10-CM | POA: Diagnosis present

## 2019-12-30 DIAGNOSIS — Z87891 Personal history of nicotine dependence: Secondary | ICD-10-CM | POA: Diagnosis not present

## 2019-12-30 DIAGNOSIS — K2210 Ulcer of esophagus without bleeding: Secondary | ICD-10-CM | POA: Diagnosis present

## 2019-12-30 DIAGNOSIS — J449 Chronic obstructive pulmonary disease, unspecified: Secondary | ICD-10-CM | POA: Diagnosis present

## 2019-12-30 DIAGNOSIS — E876 Hypokalemia: Secondary | ICD-10-CM | POA: Diagnosis present

## 2019-12-30 DIAGNOSIS — K7581 Nonalcoholic steatohepatitis (NASH): Secondary | ICD-10-CM | POA: Diagnosis present

## 2019-12-30 DIAGNOSIS — K8067 Calculus of gallbladder and bile duct with acute and chronic cholecystitis with obstruction: Secondary | ICD-10-CM | POA: Diagnosis present

## 2019-12-30 DIAGNOSIS — K838 Other specified diseases of biliary tract: Secondary | ICD-10-CM | POA: Diagnosis not present

## 2019-12-30 DIAGNOSIS — K2100 Gastro-esophageal reflux disease with esophagitis, without bleeding: Secondary | ICD-10-CM

## 2019-12-30 HISTORY — PX: ERCP: SHX5425

## 2019-12-30 HISTORY — PX: BALLOON DILATION: SHX5330

## 2019-12-30 HISTORY — PX: ESOPHAGOGASTRODUODENOSCOPY: SHX5428

## 2019-12-30 HISTORY — PX: REMOVAL OF STONES: SHX5545

## 2019-12-30 HISTORY — PX: SPHINCTEROTOMY: SHX5544

## 2019-12-30 LAB — COMPREHENSIVE METABOLIC PANEL
ALT: 87 U/L — ABNORMAL HIGH (ref 0–44)
AST: 57 U/L — ABNORMAL HIGH (ref 15–41)
Albumin: 2.4 g/dL — ABNORMAL LOW (ref 3.5–5.0)
Alkaline Phosphatase: 992 U/L — ABNORMAL HIGH (ref 38–126)
Anion gap: 8 (ref 5–15)
BUN: <5 mg/dL — ABNORMAL LOW (ref 6–20)
CO2: 24 mmol/L (ref 22–32)
Calcium: 8.4 mg/dL — ABNORMAL LOW (ref 8.9–10.3)
Chloride: 103 mmol/L (ref 98–111)
Creatinine, Ser: 0.41 mg/dL — ABNORMAL LOW (ref 0.44–1.00)
GFR, Estimated: >60 mL/min (ref >60)
Glucose, Bld: 115 mg/dL — ABNORMAL HIGH (ref 70–99)
Potassium: 3.2 mmol/L — ABNORMAL LOW (ref 3.5–5.1)
Sodium: 135 mmol/L (ref 135–145)
Total Bilirubin: 0.8 mg/dL (ref 0.3–1.2)
Total Protein: 5.6 g/dL — ABNORMAL LOW (ref 6.5–8.1)

## 2019-12-30 LAB — CBC
HCT: 28.2 % — ABNORMAL LOW (ref 36.0–46.0)
Hemoglobin: 9.3 g/dL — ABNORMAL LOW (ref 12.0–15.0)
MCH: 32.7 pg (ref 26.0–34.0)
MCHC: 33 g/dL (ref 30.0–36.0)
MCV: 99.3 fL (ref 80.0–100.0)
Platelets: 357 10*3/uL (ref 150–400)
RBC: 2.84 MIL/uL — ABNORMAL LOW (ref 3.87–5.11)
RDW: 12.9 % (ref 11.5–15.5)
WBC: 8.8 10*3/uL (ref 4.0–10.5)
nRBC: 0 % (ref 0.0–0.2)

## 2019-12-30 SURGERY — ERCP, WITH INTERVENTION IF INDICATED
Anesthesia: General

## 2019-12-30 MED ORDER — PANTOPRAZOLE SODIUM 40 MG PO TBEC
40.0000 mg | DELAYED_RELEASE_TABLET | Freq: Every day | ORAL | Status: DC
  Administered 2019-12-31: 40 mg via ORAL
  Filled 2019-12-30: qty 1

## 2019-12-30 MED ORDER — POTASSIUM CHLORIDE 10 MEQ/100ML IV SOLN
10.0000 meq | INTRAVENOUS | Status: AC
  Administered 2019-12-30 (×3): 10 meq via INTRAVENOUS
  Filled 2019-12-30 (×3): qty 100

## 2019-12-30 MED ORDER — FENTANYL CITRATE (PF) 100 MCG/2ML IJ SOLN
INTRAMUSCULAR | Status: DC | PRN
  Administered 2019-12-30 (×2): 50 ug via INTRAVENOUS

## 2019-12-30 MED ORDER — INDOMETHACIN 50 MG RE SUPP
RECTAL | Status: AC
  Filled 2019-12-30: qty 2

## 2019-12-30 MED ORDER — DEXAMETHASONE SODIUM PHOSPHATE 10 MG/ML IJ SOLN
INTRAMUSCULAR | Status: DC | PRN
  Administered 2019-12-30: 10 mg via INTRAVENOUS

## 2019-12-30 MED ORDER — INDOMETHACIN 50 MG RE SUPP
RECTAL | Status: DC | PRN
  Administered 2019-12-30: 100 mg via RECTAL

## 2019-12-30 MED ORDER — MIDAZOLAM HCL 5 MG/5ML IJ SOLN
INTRAMUSCULAR | Status: DC | PRN
  Administered 2019-12-30: 2 mg via INTRAVENOUS

## 2019-12-30 MED ORDER — LACTATED RINGERS IV SOLN
INTRAVENOUS | Status: AC | PRN
  Administered 2019-12-30: 20 mL/h via INTRAVENOUS

## 2019-12-30 MED ORDER — PROPOFOL 10 MG/ML IV BOLUS
INTRAVENOUS | Status: AC
  Filled 2019-12-30: qty 20

## 2019-12-30 MED ORDER — MIDAZOLAM HCL 2 MG/2ML IJ SOLN
INTRAMUSCULAR | Status: AC
  Filled 2019-12-30: qty 2

## 2019-12-30 MED ORDER — CIPROFLOXACIN IN D5W 400 MG/200ML IV SOLN
INTRAVENOUS | Status: AC
  Filled 2019-12-30: qty 200

## 2019-12-30 MED ORDER — FENTANYL CITRATE (PF) 100 MCG/2ML IJ SOLN
INTRAMUSCULAR | Status: AC
  Filled 2019-12-30: qty 2

## 2019-12-30 MED ORDER — SODIUM CHLORIDE 0.9 % IV SOLN
INTRAVENOUS | Status: DC | PRN
  Administered 2019-12-30: 25 mL

## 2019-12-30 MED ORDER — ROCURONIUM BROMIDE 10 MG/ML (PF) SYRINGE
PREFILLED_SYRINGE | INTRAVENOUS | Status: DC | PRN
  Administered 2019-12-30: 60 mg via INTRAVENOUS

## 2019-12-30 MED ORDER — SUGAMMADEX SODIUM 200 MG/2ML IV SOLN
INTRAVENOUS | Status: DC | PRN
  Administered 2019-12-30: 150 mg via INTRAVENOUS

## 2019-12-30 MED ORDER — ONDANSETRON HCL 4 MG/2ML IJ SOLN
INTRAMUSCULAR | Status: DC | PRN
  Administered 2019-12-30: 4 mg via INTRAVENOUS

## 2019-12-30 MED ORDER — LIDOCAINE 2% (20 MG/ML) 5 ML SYRINGE
INTRAMUSCULAR | Status: DC | PRN
  Administered 2019-12-30: 40 mg via INTRAVENOUS

## 2019-12-30 MED ORDER — GLUCAGON HCL RDNA (DIAGNOSTIC) 1 MG IJ SOLR
INTRAMUSCULAR | Status: AC
  Filled 2019-12-30: qty 1

## 2019-12-30 MED ORDER — PROPOFOL 10 MG/ML IV BOLUS
INTRAVENOUS | Status: DC | PRN
  Administered 2019-12-30: 110 mg via INTRAVENOUS

## 2019-12-30 MED ORDER — PHENYLEPHRINE 40 MCG/ML (10ML) SYRINGE FOR IV PUSH (FOR BLOOD PRESSURE SUPPORT)
PREFILLED_SYRINGE | INTRAVENOUS | Status: DC | PRN
  Administered 2019-12-30 (×3): 80 ug via INTRAVENOUS

## 2019-12-30 NOTE — Op Note (Signed)
[facility] Patient Name: Katrina Calhoun Procedure Date: 12/30/2019 MRN: [id number] Attending MD: Docia Chuck. Henrene Pastor MD, MD Date of Birth: Feb 16, 1965 CSN: [id number] Age: 54 Admit Type: Inpatient Procedure:                ERCP w/ sphincterotomy and CBD stone extraction                           EGD with esophageal dilation Indications:              Abdominal pain in the right upper quadrant, Biliary                            dilation on Computed Tomogram Scan, Bile duct stone                            on Computed Tomogram Scan, Abnormal liver function                            test; patient also complained of dysphagia Providers:                Docia Chuck. Henrene Pastor MD, MD, Grace Isaac, RN, Tyna Jaksch Technician Referring MD:             [facility] Medicines:                General Anesthesia Complications:            No immediate complications. Estimated Blood Loss:     Estimated blood loss: none. Procedure:                Pre-Anesthesia Assessment:                           - Prior to the procedure, a History and Physical                            was performed, and patient medications and                            allergies were reviewed. The patient is competent.                            The risks and benefits of the procedure and the                            sedation options and risks were discussed with the                            patient. All questions were answered and informed                            consent was obtained. Patient identification and  proposed procedure were verified by the physician.                            Mental Status Examination: alert and oriented.                            Airway Examination: normal oropharyngeal airway and                            neck mobility. Respiratory Examination: clear to                            auscultation. CV Examination: normal.  Prophylactic                            Antibiotics: The patient does not require                            prophylactic antibiotics. Prior Anticoagulants: The                            patient has taken no previous anticoagulant or                            antiplatelet agents. ASA Grade Assessment: II - A                            patient with mild systemic disease. After reviewing                            the risks and benefits, the patient was deemed in                            satisfactory condition to undergo the procedure.                            The anesthesia plan was to use moderate sedation /                            analgesia (conscious sedation). Immediately prior                            to administration of medications, the patient was                            re-assessed for adequacy to receive sedatives. The                            heart rate, respiratory rate, oxygen saturations,                            blood pressure, adequacy of pulmonary ventilation,  and response to care were monitored throughout the                            procedure. The physical status of the patient was                            re-assessed after the procedure.                           After obtaining informed consent, the scope was                            passed under direct vision. Throughout the                            procedure, the patient's blood pressure, pulse, and                            oxygen saturations were monitored continuously. The                            ERCP [id number] was introduced through the mouth, and                            used to inject contrast into and used to cannulate                            the bile duct. The ERCP was accomplished without                            difficulty. The patient tolerated the procedure                            well. Scope In: Scope Out: Findings:      EGD FINDINGS:.  Complete EGD performed post ERCP. The esophagus revealed       erosive esophagitis with distal stricturing. Stricture diameter       approximately 14 mm. Stomach was normal except for a sliding hiatal       hernia. The duodenal bulb and post bulbar duodenum were normal. The       major ampulla was normal.      ERCP FINDINGS:      1. A scout radiograph of the abdomen with the endoscope in position was       unremarkable      2. The common bile duct was cannulated deeply and selectively      3. Complete filling of the biliary tree revealed extrahepatic biliary       dilation to a maximal diameter of 16 mm      4. There was a 5 mm common bile duct stone      5. A biliary sphincterotomy was performed by cutting over a standard       sphincterotome in the 12:00 orientation using the ERBE system. There was       no associated bleeding.      6. The common bile duct stone was extracted with a  biliary balloon       (maximum 18 mm)      7. No filling of the cystic duct or gallbladder      8. There was no filling, manipulation, or instrumentation of the       pancreatic duct Impression:               1. Choledocholithiasis status post ERC with                            sphincterotomy and common duct stone extraction.                           2. Cholelithiasis                           3. Erosive esophagitis and esophageal stricture                            status post balloon dilation of the esophageal                            stricture to 18 mm.                           4. Otherwise unremarkable EGD Moderate Sedation:      none Recommendation:           1. Post dilation diet                           2. Pantoprazole 40 mg daily                           3. Reflux precautions                           4. Standard post ERCP observation                           5. Laparoscopic cholecystectomy per general                            surgery. GI available in the interim if needed for                             any questions or problems. Thank you Procedure Code(s):        --- Professional ---                           [phone], Endoscopic retrograde                            cholangiopancreatography (ERCP); with removal of                            calculi/debris from biliary/pancreatic duct(s)  43262, Endoscopic retrograde                            cholangiopancreatography (ERCP); with                            sphincterotomy/papillotomy Diagnosis Code(s):        --- Professional ---                           K83.8, Other specified diseases of biliary tract                           K80.50, Calculus of bile duct without cholangitis                            or cholecystitis without obstruction                           R10.11, Right upper quadrant pain                           R94.5, Abnormal results of liver function studies CPT copyright 2019 [employer]. All rights reserved. The codes documented in this report are preliminary and upon coder review may  be revised to meet current compliance requirements. Docia Chuck. Henrene Pastor MD, MD 12/30/2019 11:55:34 AM This report has been signed electronically. Number of Addenda: 0

## 2019-12-30 NOTE — Plan of Care (Signed)

## 2019-12-30 NOTE — Plan of Care (Signed)
  Problem: Education: Goal: Knowledge of General Education information will improve Description: Including pain rating scale, medication(s)/side effects and non-pharmacologic comfort measures Outcome: Progressing   Problem: Coping: Goal: Level of anxiety will decrease Outcome: Progressing   Problem: Pain Managment: Goal: General experience of comfort will improve Outcome: Progressing   

## 2019-12-30 NOTE — Transfer of Care (Signed)
Immediate Anesthesia Transfer of Care Note  Patient: Katrina Calhoun  Procedure(s) Performed: ENDOSCOPIC RETROGRADE CHOLANGIOPANCREATOGRAPHY (ERCP) (N/A ) ESOPHAGOGASTRODUODENOSCOPY (EGD) (N/A ) BALLOON DILATION (N/A ) REMOVAL OF STONES SPHINCTEROTOMY  Patient Location: PACU  Anesthesia Type:General  Level of Consciousness: awake, alert  and oriented  Airway & Oxygen Therapy: Patient Spontanous Breathing and Patient connected to face mask oxygen  Post-op Assessment: Report given to RN and Post -op Vital signs reviewed and stable  Post vital signs: Reviewed and stable  Last Vitals:  Vitals Value Taken Time  BP    Temp    Pulse 99 12/30/19 1155  Resp 18 12/30/19 1155  SpO2 100 % 12/30/19 1155  Vitals shown include unvalidated device data.  Last Pain:  Vitals:   12/30/19 0940  TempSrc: Oral  PainSc: 0-No pain      Patients Stated Pain Goal: 4 (22/48/25 0037)  Complications: No complications documented.

## 2019-12-30 NOTE — H&P (View-Only) (Signed)
Reason for Consult:  Gallstones/ choledocholithiasis Referring Physician: [name] is an 54 y.o. female.  HPI: This is a 54 year old female transferred from [facility] for choledocholithiasis with obstruction.  She presented with a one-week history of worsening epigastric abdominal pain, nausea.  She was found to have a T. Bili of 2.8, elevated alk phos and AST/ALT.  CT scan showed dilated CBD.  She tested negative for COVID at [facility].  She was transferred for evaluation and GI consultation.  MRCP confirmed choledocholithiasis but no sign of acute cholecystitis.  She is scheduled for ERCP today.  We are consulted for possible cholecystectomy this admission.  Past Medical History:  Diagnosis Date  . Breast cancer ([employer]) 2013   Left Breast CA  . Smoking history 02/2015   stopped smoking    History reviewed. No pertinent surgical history.  History reviewed. No pertinent family history.  Social History: Beer every day  Allergies: No Known Allergies  Medications:  Prior to Admission medications   Medication Sig Start Date End Date Taking? Authorizing Provider  Aclidinium Bromide (TUDORZA PRESSAIR) 400 MCG/ACT AEPB Inhale 400 mcg into the lungs every 12 (twelve) hours. 12/26/18  Yes [provider]  albuterol (VENTOLIN HFA) 108 (90 Base) MCG/ACT inhaler Inhale 2 puffs into the lungs every 6 (six) hours as needed for wheezing. 12/26/18  Yes [provider]  amLODipine (NORVASC) 5 MG tablet Take 5 mg by mouth daily. 10/09/19  Yes [provider]  Fluticasone-Salmeterol (ADVAIR) 250-50 MCG/DOSE AEPB Inhale 1 puff into the lungs every 12 (twelve) hours. 12/26/18  Yes [provider]  Ipratropium-Albuterol (COMBIVENT) 20-100 MCG/ACT AERS respimat Inhale 2 puffs into the lungs every 4 (four) hours as needed for wheezing or shortness of breath.  02/14/18  Yes [provider]     Results for orders placed or performed during  the hospital encounter of 12/28/19 (from the past 48 hour(s))  HIV Antibody (routine testing w rflx)     Status: None   Collection Time: 12/28/19 10:33 AM  Result Value Ref Range   HIV Screen 4th Generation wRfx Non Reactive Non Reactive    Comment: Performed at [facility], [place] [address] [place], [place] [address]  Comprehensive metabolic panel     Status: Abnormal   Collection Time: 12/28/19 10:33 AM  Result Value Ref Range   Sodium 134 (L) 135 - 145 mmol/L   Potassium 3.1 (L) 3.5 - 5.1 mmol/L   Chloride 102 98 - 111 mmol/L   CO2 25 22 - 32 mmol/L   Glucose, Bld 99 70 - 99 mg/dL    Comment: Glucose reference range applies only to samples taken after fasting for at least 8 hours.   BUN 6 6 - 20 mg/dL   Creatinine, Ser 0.41 (L) 0.44 - 1.00 mg/dL   Calcium 8.6 (L) 8.9 - 10.3 mg/dL   Total Protein 5.8 (L) 6.5 - 8.1 g/dL   Albumin 2.6 (L) 3.5 - 5.0 g/dL   AST 158 (H) 15 - 41 U/L   ALT 161 (H) 0 - 44 U/L   Alkaline Phosphatase 1,239 (H) 38 - 126 U/L   Total Bilirubin 1.0 0.3 - 1.2 mg/dL   GFR, Estimated >60 >60 mL/min    Comment: (NOTE) Calculated using the CKD-EPI Creatinine Equation (2021)    Anion gap 7 5 - 15    Comment: Performed at [facility], [place] [address] [place], [place] [address]  CBC  WITH DIFFERENTIAL     Status: Abnormal   Collection Time: 12/28/19 10:33 AM  Result Value Ref Range   WBC 9.8 4.0 - 10.5 K/uL   RBC 2.92 (L) 3.87 - 5.11 MIL/uL   Hemoglobin 9.6 (L) 12.0 - 15.0 g/dL   HCT 29.2 (L) 36 - 46 %   MCV 100.0 80.0 - 100.0 fL   MCH 32.9 26.0 - 34.0 pg   MCHC 32.9 30.0 - 36.0 g/dL   RDW 13.6 11.5 - 15.5 %   Platelets 372 150 - 400 K/uL   nRBC 0.0 0.0 - 0.2 %   Neutrophils Relative % 78 %   Neutro Abs 7.8 (H) 1.7 - 7.7 K/uL   Lymphocytes Relative 10 %   Lymphs Abs 1.0 0.7 - 4.0 K/uL   Monocytes Relative 9 %   Monocytes Absolute 0.8 0.1 - 1.0 K/uL   Eosinophils Relative 1 %   Eosinophils Absolute 0.1 0.0 - 0.5 K/uL   Basophils  Relative 1 %   Basophils Absolute 0.1 0.0 - 0.1 K/uL   Immature Granulocytes 1 %   Abs Immature Granulocytes 0.05 0.00 - 0.07 K/uL    Comment: Performed at [facility], [place] [address], [place], [place] [address]  Lipase, blood     Status: Abnormal   Collection Time: 12/28/19 10:33 AM  Result Value Ref Range   Lipase 73 (H) 11 - 51 U/L    Comment: Performed at [facility], [place] [address], [place], [place] [address]  Magnesium     Status: None   Collection Time: 12/28/19 10:33 AM  Result Value Ref Range   Magnesium 1.8 1.7 - 2.4 mg/dL    Comment: Performed at [facility], [place] [address] [place], [place] [address]  Comprehensive metabolic panel     Status: Abnormal   Collection Time: 12/29/19  3:29 AM  Result Value Ref Range   Sodium 133 (L) 135 - 145 mmol/L   Potassium 3.0 (L) 3.5 - 5.1 mmol/L   Chloride 100 98 - 111 mmol/L   CO2 22 22 - 32 mmol/L   Glucose, Bld 82 70 - 99 mg/dL    Comment: Glucose reference range applies only to samples taken after fasting for at least 8 hours.   BUN 6 6 - 20 mg/dL   Creatinine, Ser 0.40 (L) 0.44 - 1.00 mg/dL   Calcium 8.4 (L) 8.9 - 10.3 mg/dL   Total Protein 5.4 (L) 6.5 - 8.1 g/dL   Albumin 2.5 (L) 3.5 - 5.0 g/dL   AST 59 (H) 15 - 41 U/L   ALT 107 (H) 0 - 44 U/L   Alkaline Phosphatase 953 (H) 38 - 126 U/L   Total Bilirubin 1.1 0.3 - 1.2 mg/dL   GFR, Estimated >60 >60 mL/min    Comment: (NOTE) Calculated using the CKD-EPI Creatinine Equation (2021)    Anion gap 11 5 - 15    Comment: Performed at [facility], [place] [address], [place], [place] [address]  Lipase, blood     Status: Abnormal   Collection Time: 12/29/19  3:29 AM  Result Value Ref Range   Lipase 54 (H) 11 - 51 U/L    Comment: Performed at [facility], [place] [address], [place], [place] [address]  Resp Panel by RT-PCR (Flu A&B, Covid) Nasopharyngeal Swab     Status: None   Collection  Time: 12/29/19  3:55 PM   Specimen: Nasopharyngeal Swab; Nasopharyngeal(NP) swabs in  vial transport medium  Result Value Ref Range   SARS Coronavirus 2 by RT PCR NEGATIVE NEGATIVE    Comment: (NOTE) SARS-CoV-2 target nucleic acids are NOT DETECTED.  The SARS-CoV-2 RNA is generally detectable in upper respiratory specimens during the acute phase of infection. The lowest concentration of SARS-CoV-2 viral copies this assay can detect is 138 copies/mL. A negative result does not preclude SARS-Cov-2 infection and should not be used as the sole basis for treatment or other patient management decisions. A negative result may occur with  improper specimen collection/handling, submission of specimen other than nasopharyngeal swab, presence of viral mutation(s) within the areas targeted by this assay, and inadequate number of viral copies(<138 copies/mL). A negative result must be combined with clinical observations, patient history, and epidemiological information. The expected result is Negative.  Fact Sheet for Patients:  EntrepreneurPulse.com.au  Fact Sheet for Healthcare Providers:  IncredibleEmployment.be  This test is no t yet approved or cleared by the [place] FDA and  has been authorized for detection and/or diagnosis of SARS-CoV-2 by FDA under an Emergency Use Authorization (EUA). This EUA will remain  in effect (meaning this test can be used) for the duration of the COVID-19 declaration under Section 564(b)(1) of the Act, 21 U.S.C.section 360bbb-3(b)(1), unless the authorization is terminated  or revoked sooner.       Influenza A by PCR NEGATIVE NEGATIVE   Influenza B by PCR NEGATIVE NEGATIVE    Comment: (NOTE) The Xpert Xpress SARS-CoV-2/FLU/RSV plus assay is intended as an aid in the diagnosis of influenza from Nasopharyngeal swab specimens and should not be used as a sole basis for treatment. Nasal washings and aspirates are  unacceptable for Xpert Xpress SARS-CoV-2/FLU/RSV testing.  Fact Sheet for Patients: EntrepreneurPulse.com.au  Fact Sheet for Healthcare Providers: IncredibleEmployment.be  This test is not yet approved or cleared by the [place] FDA and has been authorized for detection and/or diagnosis of SARS-CoV-2 by FDA under an Emergency Use Authorization (EUA). This EUA will remain in effect (meaning this test can be used) for the duration of the COVID-19 declaration under Section 564(b)(1) of the Act, 21 U.S.C. section 360bbb-3(b)(1), unless the authorization is terminated or revoked.  Performed at [facility], [place] [address], [place], [place] [address]   Comprehensive metabolic panel     Status: Abnormal   Collection Time: 12/30/19  3:03 AM  Result Value Ref Range   Sodium 135 135 - 145 mmol/L   Potassium 3.2 (L) 3.5 - 5.1 mmol/L   Chloride 103 98 - 111 mmol/L   CO2 24 22 - 32 mmol/L   Glucose, Bld 115 (H) 70 - 99 mg/dL    Comment: Glucose reference range applies only to samples taken after fasting for at least 8 hours.   BUN <5 (L) 6 - 20 mg/dL   Creatinine, Ser 0.41 (L) 0.44 - 1.00 mg/dL   Calcium 8.4 (L) 8.9 - 10.3 mg/dL   Total Protein 5.6 (L) 6.5 - 8.1 g/dL   Albumin 2.4 (L) 3.5 - 5.0 g/dL   AST 57 (H) 15 - 41 U/L   ALT 87 (H) 0 - 44 U/L   Alkaline Phosphatase 992 (H) 38 - 126 U/L   Total Bilirubin 0.8 0.3 - 1.2 mg/dL   GFR, Estimated >60 >60 mL/min    Comment: (NOTE) Calculated using the CKD-EPI Creatinine Equation (2021)    Anion gap 8 5 - 15    Comment: Performed at [facility], [place]  [address] [place], [place] [address]  CBC     Status: Abnormal   Collection Time: 12/30/19  3:03 AM  Result Value Ref Range   WBC 8.8 4.0 - 10.5 K/uL   RBC 2.84 (L) 3.87 - 5.11 MIL/uL   Hemoglobin 9.3 (L) 12.0 - 15.0 g/dL   HCT 28.2 (L) 36 - 46 %   MCV 99.3 80.0 - 100.0 fL   MCH 32.7 26.0 - 34.0 pg   MCHC  33.0 30.0 - 36.0 g/dL   RDW 12.9 11.5 - 15.5 %   Platelets 357 150 - 400 K/uL   nRBC 0.0 0.0 - 0.2 %    Comment: Performed at [facility], [place] [address] [place], [place] [address]    MR 3D Recon At Scanner  Result Date: 12/29/2019 CLINICAL DATA:  Abdominal pain. Elevated bilirubin. Evaluate for biliary obstruction EXAM: MRI ABDOMEN WITHOUT AND WITH CONTRAST (INCLUDING MRCP) TECHNIQUE: Multiplanar multisequence MR imaging of the abdomen was performed both before and after the administration of intravenous contrast. Heavily T2-weighted images of the biliary and pancreatic ducts were obtained, and three-dimensional MRCP images were rendered by post processing. CONTRAST:  11m GADAVIST GADOBUTROL 1 MMOL/ML IV SOLN COMPARISON:  None. FINDINGS: Lower chest: Lung bases are clear. Hepatobiliary: No intrahepatic biliary duct dilatation. The common hepatic duct measures 10 mm. The common bile duct measures 12 mm. There is a filling defect within distal common bile duct measuring 3 mm on image 22/series 3. The distal common bile duct stone is also well seen on the MR P sequences (series 13) There multiple gallstones within the gallbladder. No gallbladder distension. Pancreas: No pancreatic duct dilatation. No significant peripancreatic inflammation. The distal common bile duct stone is well seen on coronal image 15/5) Spleen: Normal spleen Adrenals/urinary tract: Adrenal glands and kidneys normal. Stomach/Bowel: Stomach, small bowel, appendix, and cecum are normal. The colon and rectosigmoid colon are normal. Vascular/Lymphatic: Abdominal aorta is normal caliber. No periportal or retroperitoneal adenopathy. No pelvic adenopathy. Other: No free fluid. Musculoskeletal: No aggressive osseous lesion. IMPRESSION: 1. Distal common bile duct stone (choledocholithiasis) with moderate extrahepatic duct dilatation. 2. Cholelithiasis without evidence of acute cholecystitis. 3. No pancreatic duct dilatation  or peripancreatic inflammation. Findings conveyed to Dr. BKennon Holterby Dr. BDerrel Nipon 11 19 2021 at 6:30 p.m. Electronically Signed   By: SSuzy BouchardM.D.   On: 12/29/2019 09:57   MR ABDOMEN MRCP W WO CONTAST  Result Date: 12/29/2019 CLINICAL DATA:  Abdominal pain. Elevated bilirubin. Evaluate for biliary obstruction EXAM: MRI ABDOMEN WITHOUT AND WITH CONTRAST (INCLUDING MRCP) TECHNIQUE: Multiplanar multisequence MR imaging of the abdomen was performed both before and after the administration of intravenous contrast. Heavily T2-weighted images of the biliary and pancreatic ducts were obtained, and three-dimensional MRCP images were rendered by post processing. CONTRAST:  55mGADAVIST GADOBUTROL 1 MMOL/ML IV SOLN COMPARISON:  None. FINDINGS: Lower chest: Lung bases are clear. Hepatobiliary: No intrahepatic biliary duct dilatation. The common hepatic duct measures 10 mm. The common bile duct measures 12 mm. There is a filling defect within distal common bile duct measuring 3 mm on image 22/series 3. The distal common bile duct stone is also well seen on the MR P sequences (series 13) There multiple gallstones within the gallbladder. No gallbladder distension. Pancreas: No pancreatic duct dilatation. No significant peripancreatic inflammation. The distal common bile duct stone is well seen on coronal image 15/5) Spleen: Normal spleen Adrenals/urinary tract: Adrenal glands and kidneys normal. Stomach/Bowel: Stomach, small bowel, appendix, and cecum  are normal. The colon and rectosigmoid colon are normal. Vascular/Lymphatic: Abdominal aorta is normal caliber. No periportal or retroperitoneal adenopathy. No pelvic adenopathy. Other: No free fluid. Musculoskeletal: No aggressive osseous lesion. IMPRESSION: 1. Distal common bile duct stone (choledocholithiasis) with moderate extrahepatic duct dilatation. 2. Cholelithiasis without evidence of acute cholecystitis. 3. No pancreatic duct dilatation or peripancreatic  inflammation. Findings conveyed to Dr. Kennon Holter by Dr. Derrel Nip on 11 19 2021 at 6:30 p.m. Electronically Signed   By: Suzy Bouchard M.D.   On: 12/29/2019 09:57    Review of Systems  HENT: Negative for ear discharge, ear pain, hearing loss and tinnitus.   Eyes: Negative for photophobia and pain.  Respiratory: Negative for cough and shortness of breath.   Cardiovascular: Negative for chest pain.  Gastrointestinal: Positive for abdominal pain and nausea. Negative for vomiting.  Genitourinary: Negative for dysuria, flank pain, frequency and urgency.  Musculoskeletal: Negative for back pain, myalgias and neck pain.  Neurological: Negative for dizziness and headaches.  Hematological: Does not bruise/bleed easily.  Psychiatric/Behavioral: The patient is not nervous/anxious.    Blood pressure 130/74, pulse 82, temperature 99.1 F (37.3 C), temperature source Oral, resp. rate 16, height _0  (1.6 m), weight 52.2 kg, SpO2 98 %. Physical Exam Constitutional:  WDWN in NAD, conversant, no obvious deformities; lying in bed comfortably Eyes:  Pupils equal, round; sclera anicteric; moist conjunctiva; no lid lag HENT:  Oral mucosa moist; good dentition  Neck:  No masses palpated, trachea midline; no thyromegaly Lungs:  CTA bilaterally; normal respiratory effort CV:  Regular rate and rhythm; no murmurs; extremities well-perfused with no edema Abd:  +bowel sounds, soft, mild epigastric tenderness, no palpable organomegaly; no palpable hernias Musc:  Unable to assess gait; no apparent clubbing or cyanosis in extremities Lymphatic:  No palpable cervical or axillary lymphadenopathy Skin:  Warm, dry; no sign of jaundice Psychiatric - alert and oriented x 4; calm mood and affect  Assessment/Plan: Choledocholithiasis/ chronic cholecystitis  ERCP today Recheck labs Possible cholecystectomy tomorrow - NPO p MN  Katrina Calhoun 12/30/2019, 9:10 AM

## 2019-12-30 NOTE — Anesthesia Postprocedure Evaluation (Signed)
Anesthesia Post Note  Patient: Katrina Calhoun  Procedure(s) Performed: ENDOSCOPIC RETROGRADE CHOLANGIOPANCREATOGRAPHY (ERCP) (N/A ) ESOPHAGOGASTRODUODENOSCOPY (EGD) (N/A ) BALLOON DILATION (N/A ) REMOVAL OF STONES SPHINCTEROTOMY     Patient location during evaluation: Endoscopy Anesthesia Type: General Level of consciousness: awake and alert Pain management: pain level controlled Vital Signs Assessment: post-procedure vital signs reviewed and stable Respiratory status: spontaneous breathing, nonlabored ventilation, respiratory function stable and patient connected to nasal cannula oxygen Cardiovascular status: blood pressure returned to baseline and stable Postop Assessment: no apparent nausea or vomiting Anesthetic complications: no   No complications documented.  Last Vitals:  Vitals:   12/30/19 1200 12/30/19 1210  BP: (!) 143/81 (!) 162/86  Pulse: 100 (!) 105  Resp: 17 17  Temp:    SpO2: 100% 98%    Last Pain:  Vitals:   12/30/19 1210  TempSrc:   PainSc: 0-No pain                 Demetrio Leighty

## 2019-12-30 NOTE — Interval H&P Note (Signed)
History and Physical Interval Note:  12/30/2019 9:30 AM  Katrina Calhoun  has presented today for surgery, with the diagnosis of Bile duct dilation and elevated LFTs.  The various methods of treatment have been discussed with the patient and family. After consideration of risks, benefits and other options for treatment, the patient has consented to  Procedure(s): ENDOSCOPIC RETROGRADE CHOLANGIOPANCREATOGRAPHY (ERCP) (N/A) as a surgical intervention.  The patient's history has been reviewed, patient examined, no change in status, stable for surgery.  I have reviewed the patient's chart and labs.  Questions were answered to the patient's satisfaction.     Scarlette Shorts

## 2019-12-30 NOTE — Progress Notes (Signed)
[facility]                                                                              Patient Demographics  Katrina Calhoun, is a 54 y.o. female, DOB - 1965-04-04, ZHG:[id number]  Admit date - 12/28/2019   Admitting Physician Edwin Dada, MD  Outpatient Primary MD for the patient is Pcp, No  Outpatient specialists:   LOS - 1  days   Medical records reviewed and are as summarized below:    No chief complaint on file.      Brief summary   Patient is a 54 year old female with history of COPD, hypertension presented with 1 week of progressive epigastric pain.  Over the last 1 week, she had intermittent colicky, severe epigastric pain with nausea. She initially went to ER at [facility] where AST/ALT> 300, total bilirubin 2.8 and CT abdomen showed dilated CBD.  Patient was transferred to [facility] for GI evaluation and ERCP  Assessment & Plan    Principal Problem:   Choledocholithiasis with elevated transaminitis -MRCP showed distal CBD stone with moderate extrahepatic ductal dilation, cholelithiasis without evidence of cholecystitis -Alk phos 1239 on admission, AST 158, ALT 161, total bili 1.0, lipase 73 -LFTs now improving, ERCP today.  General surgery also consulted   Active Problems:   COPD with chronic bronchitis (HCC) -No active wheezing, continue inhalers Dulera, Incruse Ellipta, albuterol nebs as needed -Per patient quit smoking 4 months ago  Hypokalemia -N.p.o. hence replaced IV  Hyponatremia -Likely due to poor p.o. intake prior to admission, continue IV fluid hydration  Essential hypertension BP currently stable, continue Norvasc  Code Status: Full CODE STATUS DVT Prophylaxis: SCDs Family Communication: Discussed all imaging results, lab results, explained to the patient's significant other at the bedside   Disposition Plan:     Status is: Observation  The patient will require  Inpatient level of care appropriate due to  severity of illness  Dispo: The patient is from: Home              Anticipated d/c is to: Home              Anticipated d/c date is: 2 days              Patient currently is not medically stable to d/c.  ERCP today     Time Spent in minutes 25 minutes  Procedures:  MRCP  Consultants:   General surgery Gastroenterology  Antimicrobials:   Anti-infectives (From admission, onward)   Start     Dose/Rate Route Frequency Ordered Stop   12/29/19 1500  [MAR Hold]  piperacillin-tazobactam (ZOSYN) IVPB 3.375 g        (MAR Hold since Sun 12/30/2019 at 0938.Hold Reason: Transfer to a Procedural area.)   3.375 g 12.5 mL/hr over 240 Minutes Intravenous Every 8 hours 12/29/19 1354           Medications  Scheduled Meds: . [MAR Hold] amLODipine  5 mg Oral Daily  . [MAR Hold] mometasone-formoterol  2 puff Inhalation BID  . [MAR Hold] umeclidinium bromide  1 puff Inhalation Daily   Continuous Infusions: . sodium  chloride 100 mL/hr at 12/30/19 0600  . lactated ringers 20 mL/hr at 12/30/19 1023  . [MAR Hold] piperacillin-tazobactam (ZOSYN)  IV 3.375 g (12/30/19 0055)  . potassium chloride 10 mEq (12/30/19 1016)   PRN Meds:.[MAR Hold] albuterol, [MAR Hold]  HYDROmorphone (DILAUDID) injection, lactated ringers, [MAR Hold] ondansetron (ZOFRAN) IV      Subjective:   Katrina Calhoun was seen and examined today.  Pain still in the epigastric and right upper quadrant region however improving, had pain medication this morning.  No fevers or chills.  No chest pain or shortness of breath.  No acute events overnight.  Afebrile  Objective:   Vitals:   12/29/19 2111 12/30/19 0130 12/30/19 0452 12/30/19 0940  BP: 137/85 123/77 130/74 (!) 164/80  Pulse: (!) 104 91 82   Resp: $Remo'17 17 16 12  'lZBDF$ Temp: 98.5 F (36.9 C) 98.6 F (37 C) 99.1 F (37.3 C) 98.5 F (36.9 C)  TempSrc: Oral Oral Oral Oral  SpO2: 100% 98% 98% 97%  Weight:      Height:        Intake/Output Summary (Last 24 hours) at  12/30/2019 1114 Last data filed at 12/30/2019 1031 Gross per 24 hour  Intake 3069.03 ml  Output --  Net 3069.03 ml     Wt Readings from Last 3 Encounters:  12/28/19 52.2 kg    Physical Exam  General: Alert and oriented x 3, NAD  Cardiovascular: S1 S2 clear, RRR. No pedal edema b/l  Respiratory: CTAB, no wheezing, rales or rhonchi  Gastrointestinal: Soft, mild epigastric and RUQ TTP , nondistended, NBS  Ext: no pedal edema bilaterally  Neuro: no new deficits  Musculoskeletal: No cyanosis, clubbing  Skin: No rashes  Psych: Normal affect and demeanor, alert and oriented x3    Data Reviewed:  I have personally reviewed following labs and imaging studies  Micro Results Recent Results (from the past 240 hour(s))  Resp Panel by RT-PCR (Flu A&B, Covid) Nasopharyngeal Swab     Status: None   Collection Time: 12/29/19  3:55 PM   Specimen: Nasopharyngeal Swab; Nasopharyngeal(NP) swabs in vial transport medium  Result Value Ref Range Status   SARS Coronavirus 2 by RT PCR NEGATIVE NEGATIVE Final    Comment: (NOTE) SARS-CoV-2 target nucleic acids are NOT DETECTED.  The SARS-CoV-2 RNA is generally detectable in upper respiratory specimens during the acute phase of infection. The lowest concentration of SARS-CoV-2 viral copies this assay can detect is 138 copies/mL. A negative result does not preclude SARS-Cov-2 infection and should not be used as the sole basis for treatment or other patient management decisions. A negative result may occur with  improper specimen collection/handling, submission of specimen other than nasopharyngeal swab, presence of viral mutation(s) within the areas targeted by this assay, and inadequate number of viral copies(<138 copies/mL). A negative result must be combined with clinical observations, patient history, and epidemiological information. The expected result is Negative.  Fact Sheet for Patients:   EntrepreneurPulse.com.au  Fact Sheet for Healthcare Providers:  IncredibleEmployment.be  This test is no t yet approved or cleared by the [place] FDA and  has been authorized for detection and/or diagnosis of SARS-CoV-2 by FDA under an Emergency Use Authorization (EUA). This EUA will remain  in effect (meaning this test can be used) for the duration of the COVID-19 declaration under Section 564(b)(1) of the Act, 21 U.S.C.section 360bbb-3(b)(1), unless the authorization is terminated  or revoked sooner.       Influenza A by PCR NEGATIVE  NEGATIVE Final   Influenza B by PCR NEGATIVE NEGATIVE Final    Comment: (NOTE) The Xpert Xpress SARS-CoV-2/FLU/RSV plus assay is intended as an aid in the diagnosis of influenza from Nasopharyngeal swab specimens and should not be used as a sole basis for treatment. Nasal washings and aspirates are unacceptable for Xpert Xpress SARS-CoV-2/FLU/RSV testing.  Fact Sheet for Patients: EntrepreneurPulse.com.au  Fact Sheet for Healthcare Providers: IncredibleEmployment.be  This test is not yet approved or cleared by the [place] FDA and has been authorized for detection and/or diagnosis of SARS-CoV-2 by FDA under an Emergency Use Authorization (EUA). This EUA will remain in effect (meaning this test can be used) for the duration of the COVID-19 declaration under Section 564(b)(1) of the Act, 21 U.S.C. section 360bbb-3(b)(1), unless the authorization is terminated or revoked.  Performed at [facility], [place] [address], [place], [place] [address]     Radiology Reports MR 3D Recon At Scanner  Result Date: 12/29/2019 CLINICAL DATA:  Abdominal pain. Elevated bilirubin. Evaluate for biliary obstruction EXAM: MRI ABDOMEN WITHOUT AND WITH CONTRAST (INCLUDING MRCP) TECHNIQUE: Multiplanar multisequence MR imaging of the abdomen was performed both before  and after the administration of intravenous contrast. Heavily T2-weighted images of the biliary and pancreatic ducts were obtained, and three-dimensional MRCP images were rendered by post processing. CONTRAST:  17mL GADAVIST GADOBUTROL 1 MMOL/ML IV SOLN COMPARISON:  None. FINDINGS: Lower chest: Lung bases are clear. Hepatobiliary: No intrahepatic biliary duct dilatation. The common hepatic duct measures 10 mm. The common bile duct measures 12 mm. There is a filling defect within distal common bile duct measuring 3 mm on image 22/series 3. The distal common bile duct stone is also well seen on the MR P sequences (series 13) There multiple gallstones within the gallbladder. No gallbladder distension. Pancreas: No pancreatic duct dilatation. No significant peripancreatic inflammation. The distal common bile duct stone is well seen on coronal image 15/5) Spleen: Normal spleen Adrenals/urinary tract: Adrenal glands and kidneys normal. Stomach/Bowel: Stomach, small bowel, appendix, and cecum are normal. The colon and rectosigmoid colon are normal. Vascular/Lymphatic: Abdominal aorta is normal caliber. No periportal or retroperitoneal adenopathy. No pelvic adenopathy. Other: No free fluid. Musculoskeletal: No aggressive osseous lesion. IMPRESSION: 1. Distal common bile duct stone (choledocholithiasis) with moderate extrahepatic duct dilatation. 2. Cholelithiasis without evidence of acute cholecystitis. 3. No pancreatic duct dilatation or peripancreatic inflammation. Findings conveyed to Dr. Kennon Holter by Dr. Derrel Nip on 11 19 2021 at 6:30 p.m. Electronically Signed   By: Suzy Bouchard M.D.   On: 12/29/2019 09:57   MR ABDOMEN MRCP W WO CONTAST  Result Date: 12/29/2019 CLINICAL DATA:  Abdominal pain. Elevated bilirubin. Evaluate for biliary obstruction EXAM: MRI ABDOMEN WITHOUT AND WITH CONTRAST (INCLUDING MRCP) TECHNIQUE: Multiplanar multisequence MR imaging of the abdomen was performed both before and after the  administration of intravenous contrast. Heavily T2-weighted images of the biliary and pancreatic ducts were obtained, and three-dimensional MRCP images were rendered by post processing. CONTRAST:  25mL GADAVIST GADOBUTROL 1 MMOL/ML IV SOLN COMPARISON:  None. FINDINGS: Lower chest: Lung bases are clear. Hepatobiliary: No intrahepatic biliary duct dilatation. The common hepatic duct measures 10 mm. The common bile duct measures 12 mm. There is a filling defect within distal common bile duct measuring 3 mm on image 22/series 3. The distal common bile duct stone is also well seen on the MR P sequences (series 13) There multiple gallstones within the gallbladder. No gallbladder distension. Pancreas: No pancreatic duct dilatation. No significant peripancreatic  inflammation. The distal common bile duct stone is well seen on coronal image 15/5) Spleen: Normal spleen Adrenals/urinary tract: Adrenal glands and kidneys normal. Stomach/Bowel: Stomach, small bowel, appendix, and cecum are normal. The colon and rectosigmoid colon are normal. Vascular/Lymphatic: Abdominal aorta is normal caliber. No periportal or retroperitoneal adenopathy. No pelvic adenopathy. Other: No free fluid. Musculoskeletal: No aggressive osseous lesion. IMPRESSION: 1. Distal common bile duct stone (choledocholithiasis) with moderate extrahepatic duct dilatation. 2. Cholelithiasis without evidence of acute cholecystitis. 3. No pancreatic duct dilatation or peripancreatic inflammation. Findings conveyed to Dr. Kennon Holter by Dr. Derrel Nip on 11 19 2021 at 6:30 p.m. Electronically Signed   By: Suzy Bouchard M.D.   On: 12/29/2019 09:57    Lab Data:  CBC: Recent Labs  Lab 12/28/19 1033 12/30/19 0303  WBC 9.8 8.8  NEUTROABS 7.8*  --   HGB 9.6* 9.3*  HCT 29.2* 28.2*  MCV 100.0 99.3  PLT 372 676   Basic Metabolic Panel: Recent Labs  Lab 12/28/19 1033 12/29/19 0329 12/30/19 0303  NA 134* 133* 135  K 3.1* 3.0* 3.2*  CL 102 100 103  CO2 $Re'25 22 24   'UuT$ GLUCOSE 99 82 115*  BUN 6 6 <5*  CREATININE 0.41* 0.40* 0.41*  CALCIUM 8.6* 8.4* 8.4*  MG 1.8  --   --    GFR: Estimated Creatinine Clearance: 64.7 mL/min (A) (by C-G formula based on SCr of 0.41 mg/dL (L)). Liver Function Tests: Recent Labs  Lab 12/28/19 1033 12/29/19 0329 12/30/19 0303  AST 158* 59* 57*  ALT 161* 107* 87*  ALKPHOS 1,239* 953* 992*  BILITOT 1.0 1.1 0.8  PROT 5.8* 5.4* 5.6*  ALBUMIN 2.6* 2.5* 2.4*   Recent Labs  Lab 12/28/19 1033 12/29/19 0329  LIPASE 73* 54*   No results for input(s): AMMONIA in the last 168 hours. Coagulation Profile: No results for input(s): INR, PROTIME in the last 168 hours. Cardiac Enzymes: No results for input(s): CKTOTAL, CKMB, CKMBINDEX, TROPONINI in the last 168 hours. BNP (last 3 results) No results for input(s): PROBNP in the last 8760 hours. HbA1C: No results for input(s): HGBA1C in the last 72 hours. CBG: No results for input(s): GLUCAP in the last 168 hours. Lipid Profile: No results for input(s): CHOL, HDL, LDLCALC, TRIG, CHOLHDL, LDLDIRECT in the last 72 hours. Thyroid Function Tests: No results for input(s): TSH, T4TOTAL, FREET4, T3FREE, THYROIDAB in the last 72 hours. Anemia Panel: No results for input(s): VITAMINB12, FOLATE, FERRITIN, TIBC, IRON, RETICCTPCT in the last 72 hours. Urine analysis: No results found for: COLORURINE, APPEARANCEUR, LABSPEC, PHURINE, GLUCOSEU, HGBUR, BILIRUBINUR, KETONESUR, PROTEINUR, UROBILINOGEN, NITRITE, LEUKOCYTESUR   Glen Kesinger M.D. [facility] 12/30/2019, 11:14 AM   Call night coverage person covering after 7pm

## 2019-12-30 NOTE — Consult Note (Signed)
Reason for Consult:  Gallstones/ choledocholithiasis Referring Physician: [name] is an 54 y.o. female.  HPI: This is a 54 year old female transferred from [facility] for choledocholithiasis with obstruction.  She presented with a one-week history of worsening epigastric abdominal pain, nausea.  She was found to have a T. Bili of 2.8, elevated alk phos and AST/ALT.  CT scan showed dilated CBD.  She tested negative for COVID at [facility].  She was transferred for evaluation and GI consultation.  MRCP confirmed choledocholithiasis but no sign of acute cholecystitis.  She is scheduled for ERCP today.  We are consulted for possible cholecystectomy this admission.  Past Medical History:  Diagnosis Date  . Breast cancer ([employer]) 2013   Left Breast CA  . Smoking history 02/2015   stopped smoking    History reviewed. No pertinent surgical history.  History reviewed. No pertinent family history.  Social History: Beer every day  Allergies: No Known Allergies  Medications:  Prior to Admission medications   Medication Sig Start Date End Date Taking? Authorizing Provider  Aclidinium Bromide (TUDORZA PRESSAIR) 400 MCG/ACT AEPB Inhale 400 mcg into the lungs every 12 (twelve) hours. 12/26/18  Yes [provider]  albuterol (VENTOLIN HFA) 108 (90 Base) MCG/ACT inhaler Inhale 2 puffs into the lungs every 6 (six) hours as needed for wheezing. 12/26/18  Yes [provider]  amLODipine (NORVASC) 5 MG tablet Take 5 mg by mouth daily. 10/09/19  Yes [provider]  Fluticasone-Salmeterol (ADVAIR) 250-50 MCG/DOSE AEPB Inhale 1 puff into the lungs every 12 (twelve) hours. 12/26/18  Yes [provider]  Ipratropium-Albuterol (COMBIVENT) 20-100 MCG/ACT AERS respimat Inhale 2 puffs into the lungs every 4 (four) hours as needed for wheezing or shortness of breath.  02/14/18  Yes [provider]     Results for orders placed or performed during  the hospital encounter of 12/28/19 (from the past 48 hour(s))  HIV Antibody (routine testing w rflx)     Status: None   Collection Time: 12/28/19 10:33 AM  Result Value Ref Range   HIV Screen 4th Generation wRfx Non Reactive Non Reactive    Comment: Performed at [facility], [place] [address] [place], [place] [address]  Comprehensive metabolic panel     Status: Abnormal   Collection Time: 12/28/19 10:33 AM  Result Value Ref Range   Sodium 134 (L) 135 - 145 mmol/L   Potassium 3.1 (L) 3.5 - 5.1 mmol/L   Chloride 102 98 - 111 mmol/L   CO2 25 22 - 32 mmol/L   Glucose, Bld 99 70 - 99 mg/dL    Comment: Glucose reference range applies only to samples taken after fasting for at least 8 hours.   BUN 6 6 - 20 mg/dL   Creatinine, Ser 0.41 (L) 0.44 - 1.00 mg/dL   Calcium 8.6 (L) 8.9 - 10.3 mg/dL   Total Protein 5.8 (L) 6.5 - 8.1 g/dL   Albumin 2.6 (L) 3.5 - 5.0 g/dL   AST 158 (H) 15 - 41 U/L   ALT 161 (H) 0 - 44 U/L   Alkaline Phosphatase 1,239 (H) 38 - 126 U/L   Total Bilirubin 1.0 0.3 - 1.2 mg/dL   GFR, Estimated >60 >60 mL/min    Comment: (NOTE) Calculated using the CKD-EPI Creatinine Equation (2021)    Anion gap 7 5 - 15    Comment: Performed at [facility], [place] [address] [place], [place] [address]  CBC  WITH DIFFERENTIAL     Status: Abnormal   Collection Time: 12/28/19 10:33 AM  Result Value Ref Range   WBC 9.8 4.0 - 10.5 K/uL   RBC 2.92 (L) 3.87 - 5.11 MIL/uL   Hemoglobin 9.6 (L) 12.0 - 15.0 g/dL   HCT 29.2 (L) 36 - 46 %   MCV 100.0 80.0 - 100.0 fL   MCH 32.9 26.0 - 34.0 pg   MCHC 32.9 30.0 - 36.0 g/dL   RDW 13.6 11.5 - 15.5 %   Platelets 372 150 - 400 K/uL   nRBC 0.0 0.0 - 0.2 %   Neutrophils Relative % 78 %   Neutro Abs 7.8 (H) 1.7 - 7.7 K/uL   Lymphocytes Relative 10 %   Lymphs Abs 1.0 0.7 - 4.0 K/uL   Monocytes Relative 9 %   Monocytes Absolute 0.8 0.1 - 1.0 K/uL   Eosinophils Relative 1 %   Eosinophils Absolute 0.1 0.0 - 0.5 K/uL   Basophils  Relative 1 %   Basophils Absolute 0.1 0.0 - 0.1 K/uL   Immature Granulocytes 1 %   Abs Immature Granulocytes 0.05 0.00 - 0.07 K/uL    Comment: Performed at [facility], [place] [address], [place], [place] [address]  Lipase, blood     Status: Abnormal   Collection Time: 12/28/19 10:33 AM  Result Value Ref Range   Lipase 73 (H) 11 - 51 U/L    Comment: Performed at [facility], [place] [address], [place], [place] [address]  Magnesium     Status: None   Collection Time: 12/28/19 10:33 AM  Result Value Ref Range   Magnesium 1.8 1.7 - 2.4 mg/dL    Comment: Performed at [facility], [place] [address] [place], [place] [address]  Comprehensive metabolic panel     Status: Abnormal   Collection Time: 12/29/19  3:29 AM  Result Value Ref Range   Sodium 133 (L) 135 - 145 mmol/L   Potassium 3.0 (L) 3.5 - 5.1 mmol/L   Chloride 100 98 - 111 mmol/L   CO2 22 22 - 32 mmol/L   Glucose, Bld 82 70 - 99 mg/dL    Comment: Glucose reference range applies only to samples taken after fasting for at least 8 hours.   BUN 6 6 - 20 mg/dL   Creatinine, Ser 0.40 (L) 0.44 - 1.00 mg/dL   Calcium 8.4 (L) 8.9 - 10.3 mg/dL   Total Protein 5.4 (L) 6.5 - 8.1 g/dL   Albumin 2.5 (L) 3.5 - 5.0 g/dL   AST 59 (H) 15 - 41 U/L   ALT 107 (H) 0 - 44 U/L   Alkaline Phosphatase 953 (H) 38 - 126 U/L   Total Bilirubin 1.1 0.3 - 1.2 mg/dL   GFR, Estimated >60 >60 mL/min    Comment: (NOTE) Calculated using the CKD-EPI Creatinine Equation (2021)    Anion gap 11 5 - 15    Comment: Performed at [facility], [place] [address], [place], [place] [address]  Lipase, blood     Status: Abnormal   Collection Time: 12/29/19  3:29 AM  Result Value Ref Range   Lipase 54 (H) 11 - 51 U/L    Comment: Performed at [facility], [place] [address], [place], [place] [address]  Resp Panel by RT-PCR (Flu A&B, Covid) Nasopharyngeal Swab     Status: None   Collection  Time: 12/29/19  3:55 PM   Specimen: Nasopharyngeal Swab; Nasopharyngeal(NP) swabs in  vial transport medium  Result Value Ref Range   SARS Coronavirus 2 by RT PCR NEGATIVE NEGATIVE    Comment: (NOTE) SARS-CoV-2 target nucleic acids are NOT DETECTED.  The SARS-CoV-2 RNA is generally detectable in upper respiratory specimens during the acute phase of infection. The lowest concentration of SARS-CoV-2 viral copies this assay can detect is 138 copies/mL. A negative result does not preclude SARS-Cov-2 infection and should not be used as the sole basis for treatment or other patient management decisions. A negative result may occur with  improper specimen collection/handling, submission of specimen other than nasopharyngeal swab, presence of viral mutation(s) within the areas targeted by this assay, and inadequate number of viral copies(<138 copies/mL). A negative result must be combined with clinical observations, patient history, and epidemiological information. The expected result is Negative.  Fact Sheet for Patients:  EntrepreneurPulse.com.au  Fact Sheet for Healthcare Providers:  IncredibleEmployment.be  This test is no t yet approved or cleared by the [place] FDA and  has been authorized for detection and/or diagnosis of SARS-CoV-2 by FDA under an Emergency Use Authorization (EUA). This EUA will remain  in effect (meaning this test can be used) for the duration of the COVID-19 declaration under Section 564(b)(1) of the Act, 21 U.S.C.section 360bbb-3(b)(1), unless the authorization is terminated  or revoked sooner.       Influenza A by PCR NEGATIVE NEGATIVE   Influenza B by PCR NEGATIVE NEGATIVE    Comment: (NOTE) The Xpert Xpress SARS-CoV-2/FLU/RSV plus assay is intended as an aid in the diagnosis of influenza from Nasopharyngeal swab specimens and should not be used as a sole basis for treatment. Nasal washings and aspirates are  unacceptable for Xpert Xpress SARS-CoV-2/FLU/RSV testing.  Fact Sheet for Patients: EntrepreneurPulse.com.au  Fact Sheet for Healthcare Providers: IncredibleEmployment.be  This test is not yet approved or cleared by the [place] FDA and has been authorized for detection and/or diagnosis of SARS-CoV-2 by FDA under an Emergency Use Authorization (EUA). This EUA will remain in effect (meaning this test can be used) for the duration of the COVID-19 declaration under Section 564(b)(1) of the Act, 21 U.S.C. section 360bbb-3(b)(1), unless the authorization is terminated or revoked.  Performed at [facility], [place] [address], [place], [place] [address]   Comprehensive metabolic panel     Status: Abnormal   Collection Time: 12/30/19  3:03 AM  Result Value Ref Range   Sodium 135 135 - 145 mmol/L   Potassium 3.2 (L) 3.5 - 5.1 mmol/L   Chloride 103 98 - 111 mmol/L   CO2 24 22 - 32 mmol/L   Glucose, Bld 115 (H) 70 - 99 mg/dL    Comment: Glucose reference range applies only to samples taken after fasting for at least 8 hours.   BUN <5 (L) 6 - 20 mg/dL   Creatinine, Ser 0.41 (L) 0.44 - 1.00 mg/dL   Calcium 8.4 (L) 8.9 - 10.3 mg/dL   Total Protein 5.6 (L) 6.5 - 8.1 g/dL   Albumin 2.4 (L) 3.5 - 5.0 g/dL   AST 57 (H) 15 - 41 U/L   ALT 87 (H) 0 - 44 U/L   Alkaline Phosphatase 992 (H) 38 - 126 U/L   Total Bilirubin 0.8 0.3 - 1.2 mg/dL   GFR, Estimated >60 >60 mL/min    Comment: (NOTE) Calculated using the CKD-EPI Creatinine Equation (2021)    Anion gap 8 5 - 15    Comment: Performed at [facility], [place]  [address] [place], [place] [address]  CBC     Status: Abnormal   Collection Time: 12/30/19  3:03 AM  Result Value Ref Range   WBC 8.8 4.0 - 10.5 K/uL   RBC 2.84 (L) 3.87 - 5.11 MIL/uL   Hemoglobin 9.3 (L) 12.0 - 15.0 g/dL   HCT 28.2 (L) 36 - 46 %   MCV 99.3 80.0 - 100.0 fL   MCH 32.7 26.0 - 34.0 pg   MCHC  33.0 30.0 - 36.0 g/dL   RDW 12.9 11.5 - 15.5 %   Platelets 357 150 - 400 K/uL   nRBC 0.0 0.0 - 0.2 %    Comment: Performed at [facility], [place] [address] [place], [place] [address]    MR 3D Recon At Scanner  Result Date: 12/29/2019 CLINICAL DATA:  Abdominal pain. Elevated bilirubin. Evaluate for biliary obstruction EXAM: MRI ABDOMEN WITHOUT AND WITH CONTRAST (INCLUDING MRCP) TECHNIQUE: Multiplanar multisequence MR imaging of the abdomen was performed both before and after the administration of intravenous contrast. Heavily T2-weighted images of the biliary and pancreatic ducts were obtained, and three-dimensional MRCP images were rendered by post processing. CONTRAST:  11m GADAVIST GADOBUTROL 1 MMOL/ML IV SOLN COMPARISON:  None. FINDINGS: Lower chest: Lung bases are clear. Hepatobiliary: No intrahepatic biliary duct dilatation. The common hepatic duct measures 10 mm. The common bile duct measures 12 mm. There is a filling defect within distal common bile duct measuring 3 mm on image 22/series 3. The distal common bile duct stone is also well seen on the MR P sequences (series 13) There multiple gallstones within the gallbladder. No gallbladder distension. Pancreas: No pancreatic duct dilatation. No significant peripancreatic inflammation. The distal common bile duct stone is well seen on coronal image 15/5) Spleen: Normal spleen Adrenals/urinary tract: Adrenal glands and kidneys normal. Stomach/Bowel: Stomach, small bowel, appendix, and cecum are normal. The colon and rectosigmoid colon are normal. Vascular/Lymphatic: Abdominal aorta is normal caliber. No periportal or retroperitoneal adenopathy. No pelvic adenopathy. Other: No free fluid. Musculoskeletal: No aggressive osseous lesion. IMPRESSION: 1. Distal common bile duct stone (choledocholithiasis) with moderate extrahepatic duct dilatation. 2. Cholelithiasis without evidence of acute cholecystitis. 3. No pancreatic duct dilatation  or peripancreatic inflammation. Findings conveyed to Dr. BKennon Holterby Dr. BDerrel Nipon 11 19 2021 at 6:30 p.m. Electronically Signed   By: SSuzy BouchardM.D.   On: 12/29/2019 09:57   MR ABDOMEN MRCP W WO CONTAST  Result Date: 12/29/2019 CLINICAL DATA:  Abdominal pain. Elevated bilirubin. Evaluate for biliary obstruction EXAM: MRI ABDOMEN WITHOUT AND WITH CONTRAST (INCLUDING MRCP) TECHNIQUE: Multiplanar multisequence MR imaging of the abdomen was performed both before and after the administration of intravenous contrast. Heavily T2-weighted images of the biliary and pancreatic ducts were obtained, and three-dimensional MRCP images were rendered by post processing. CONTRAST:  55mGADAVIST GADOBUTROL 1 MMOL/ML IV SOLN COMPARISON:  None. FINDINGS: Lower chest: Lung bases are clear. Hepatobiliary: No intrahepatic biliary duct dilatation. The common hepatic duct measures 10 mm. The common bile duct measures 12 mm. There is a filling defect within distal common bile duct measuring 3 mm on image 22/series 3. The distal common bile duct stone is also well seen on the MR P sequences (series 13) There multiple gallstones within the gallbladder. No gallbladder distension. Pancreas: No pancreatic duct dilatation. No significant peripancreatic inflammation. The distal common bile duct stone is well seen on coronal image 15/5) Spleen: Normal spleen Adrenals/urinary tract: Adrenal glands and kidneys normal. Stomach/Bowel: Stomach, small bowel, appendix, and cecum  are normal. The colon and rectosigmoid colon are normal. Vascular/Lymphatic: Abdominal aorta is normal caliber. No periportal or retroperitoneal adenopathy. No pelvic adenopathy. Other: No free fluid. Musculoskeletal: No aggressive osseous lesion. IMPRESSION: 1. Distal common bile duct stone (choledocholithiasis) with moderate extrahepatic duct dilatation. 2. Cholelithiasis without evidence of acute cholecystitis. 3. No pancreatic duct dilatation or peripancreatic  inflammation. Findings conveyed to Dr. Kennon Holter by Dr. Derrel Nip on 11 19 2021 at 6:30 p.m. Electronically Signed   By: Suzy Bouchard M.D.   On: 12/29/2019 09:57    Review of Systems  HENT: Negative for ear discharge, ear pain, hearing loss and tinnitus.   Eyes: Negative for photophobia and pain.  Respiratory: Negative for cough and shortness of breath.   Cardiovascular: Negative for chest pain.  Gastrointestinal: Positive for abdominal pain and nausea. Negative for vomiting.  Genitourinary: Negative for dysuria, flank pain, frequency and urgency.  Musculoskeletal: Negative for back pain, myalgias and neck pain.  Neurological: Negative for dizziness and headaches.  Hematological: Does not bruise/bleed easily.  Psychiatric/Behavioral: The patient is not nervous/anxious.    Blood pressure 130/74, pulse 82, temperature 99.1 F (37.3 C), temperature source Oral, resp. rate 16, height _0  (1.6 m), weight 52.2 kg, SpO2 98 %. Physical Exam Constitutional:  WDWN in NAD, conversant, no obvious deformities; lying in bed comfortably Eyes:  Pupils equal, round; sclera anicteric; moist conjunctiva; no lid lag HENT:  Oral mucosa moist; good dentition  Neck:  No masses palpated, trachea midline; no thyromegaly Lungs:  CTA bilaterally; normal respiratory effort CV:  Regular rate and rhythm; no murmurs; extremities well-perfused with no edema Abd:  +bowel sounds, soft, mild epigastric tenderness, no palpable organomegaly; no palpable hernias Musc:  Unable to assess gait; no apparent clubbing or cyanosis in extremities Lymphatic:  No palpable cervical or axillary lymphadenopathy Skin:  Warm, dry; no sign of jaundice Psychiatric - alert and oriented x 4; calm mood and affect  Assessment/Plan: Choledocholithiasis/ chronic cholecystitis  ERCP today Recheck labs Possible cholecystectomy tomorrow - NPO p MN  Imogene Burn Katrina Calhoun 12/30/2019, 9:10 AM

## 2019-12-30 NOTE — Anesthesia Preprocedure Evaluation (Addendum)
Anesthesia Evaluation  Patient identified by MRN, date of birth, ID band Patient awake    Reviewed: Allergy & Precautions, NPO status , Patient's Chart, lab work & pertinent test results  History of Anesthesia Complications Negative for: history of anesthetic complications  Airway Mallampati: II  TM Distance: >3 FB Neck ROM: Full    Dental  (+) Poor Dentition, Chipped, Missing,    Pulmonary COPD,  COPD inhaler, former smoker,  Covid-19 Nucleic Acid Test Results Lab Results      Component                Value               Date                      SARSCOV2NAA              NEGATIVE            12/29/2019              breath sounds clear to auscultation       Cardiovascular hypertension, Pt. on medications (-) angina(-) Past MI and (-) CHF (-) dysrhythmias  Rhythm:Regular     Neuro/Psych negative neurological ROS  negative psych ROS   GI/Hepatic negative GI ROS, Lab Results      Component                Value               Date                      ALT                      87 (H)              12/30/2019                AST                      57 (H)              12/30/2019                ALKPHOS                  992 (H)             12/30/2019                BILITOT                  0.8                 12/30/2019             Bile duct dilation and elevated LFTs   Endo/Other  negative endocrine ROS  Renal/GU negative Renal ROSLab Results      Component                Value               Date                      CREATININE               0.41 (L)            12/30/2019  Lab Results      Component                Value               Date                      K                        3.2 (L)             12/30/2019                Musculoskeletal negative musculoskeletal ROS (+)   Abdominal   Peds  Hematology Lab Results      Component                Value               Date                      WBC                       8.8                 12/30/2019                HGB                      9.3 (L)             12/30/2019                HCT                      28.2 (L)            12/30/2019                MCV                      99.3                12/30/2019                PLT                      357                 12/30/2019            No blood thinners     Anesthesia Other Findings   Reproductive/Obstetrics                            Anesthesia Physical Anesthesia Plan  ASA: II  Anesthesia Plan: General   Post-op Pain Management:    Induction: Intravenous  PONV Risk Score and Plan: 3 and Ondansetron and Dexamethasone  Airway Management Planned: Oral ETT  Additional Equipment: None  Intra-op Plan:   Post-operative Plan: Extubation in OR  Informed Consent: I have reviewed the patients History and Physical, chart, labs and discussed the procedure including the risks, benefits and alternatives for the proposed anesthesia with the patient or authorized representative who has indicated his/her understanding and acceptance.     Dental advisory given  Plan Discussed with: CRNA and Surgeon  Anesthesia Plan  Comments:         Anesthesia Quick Evaluation

## 2019-12-30 NOTE — Anesthesia Procedure Notes (Signed)
Procedure Name: Intubation Date/Time: 12/30/2019 10:30 AM Performed by: Maxwell Caul, CRNA Pre-anesthesia Checklist: Patient identified, Emergency Drugs available, Suction available and Patient being monitored Patient Re-evaluated:Patient Re-evaluated prior to induction Oxygen Delivery Method: Circle system utilized Preoxygenation: Pre-oxygenation with 100% oxygen Induction Type: IV induction Ventilation: Mask ventilation without difficulty Laryngoscope Size: Mac and 4 Grade View: Grade I Tube type: Oral Tube size: 7.5 mm Number of attempts: 1 Airway Equipment and Method: Stylet Placement Confirmation: ETT inserted through vocal cords under direct vision,  positive ETCO2 and breath sounds checked- equal and bilateral Secured at: 21 cm Tube secured with: Tape Dental Injury: Teeth and Oropharynx as per pre-operative assessment

## 2019-12-31 ENCOUNTER — Encounter (HOSPITAL_COMMUNITY)
Admission: AD | Disposition: A | Discharge status: Home / Self Care | Payer: Self-pay | Source: Other Acute Inpatient Hospital | Attending: Internal Medicine

## 2019-12-31 ENCOUNTER — Encounter (HOSPITAL_COMMUNITY): Payer: Self-pay | Admitting: Family Medicine

## 2019-12-31 ENCOUNTER — Inpatient Hospital Stay (HOSPITAL_COMMUNITY): Payer: Medicaid Other | Admitting: Anesthesiology

## 2019-12-31 DIAGNOSIS — K838 Other specified diseases of biliary tract: Secondary | ICD-10-CM

## 2019-12-31 DIAGNOSIS — J449 Chronic obstructive pulmonary disease, unspecified: Secondary | ICD-10-CM | POA: Diagnosis not present

## 2019-12-31 DIAGNOSIS — R933 Abnormal findings on diagnostic imaging of other parts of digestive tract: Secondary | ICD-10-CM | POA: Diagnosis not present

## 2019-12-31 DIAGNOSIS — K8050 Calculus of bile duct without cholangitis or cholecystitis without obstruction: Secondary | ICD-10-CM | POA: Diagnosis not present

## 2019-12-31 HISTORY — PX: LAPAROSCOPIC CHOLECYSTECTOMY SINGLE PORT: SHX5891

## 2019-12-31 LAB — COMPREHENSIVE METABOLIC PANEL
ALT: 66 U/L — ABNORMAL HIGH (ref 0–44)
AST: 26 U/L (ref 15–41)
Albumin: 2.6 g/dL — ABNORMAL LOW (ref 3.5–5.0)
Alkaline Phosphatase: 807 U/L — ABNORMAL HIGH (ref 38–126)
Anion gap: 8 (ref 5–15)
BUN: 5 mg/dL — ABNORMAL LOW (ref 6–20)
CO2: 23 mmol/L (ref 22–32)
Calcium: 8.6 mg/dL — ABNORMAL LOW (ref 8.9–10.3)
Chloride: 105 mmol/L (ref 98–111)
Creatinine, Ser: 0.5 mg/dL (ref 0.44–1.00)
GFR, Estimated: >60 mL/min (ref >60)
Glucose, Bld: 137 mg/dL — ABNORMAL HIGH (ref 70–99)
Potassium: 3.6 mmol/L (ref 3.5–5.1)
Sodium: 136 mmol/L (ref 135–145)
Total Bilirubin: 0.6 mg/dL (ref 0.3–1.2)
Total Protein: 6 g/dL — ABNORMAL LOW (ref 6.5–8.1)

## 2019-12-31 LAB — MRSA PCR SCREENING: MRSA by PCR: NEGATIVE

## 2019-12-31 SURGERY — LAPAROSCOPIC CHOLECYSTECTOMY SINGLE SITE
Anesthesia: General

## 2019-12-31 MED ORDER — TRAMADOL HCL 50 MG PO TABS
50.0000 mg | ORAL_TABLET | Freq: Once | ORAL | Status: AC
  Administered 2019-12-31: 50 mg via ORAL
  Filled 2019-12-31: qty 1

## 2019-12-31 MED ORDER — BUPIVACAINE-EPINEPHRINE (PF) 0.25% -1:200000 IJ SOLN
INTRAMUSCULAR | Status: AC
  Filled 2019-12-31: qty 30

## 2019-12-31 MED ORDER — FENTANYL CITRATE (PF) 100 MCG/2ML IJ SOLN
INTRAMUSCULAR | Status: AC
  Filled 2019-12-31: qty 2

## 2019-12-31 MED ORDER — KETAMINE HCL 10 MG/ML IJ SOLN
INTRAMUSCULAR | Status: DC | PRN
  Administered 2019-12-31: 30 mg via INTRAVENOUS

## 2019-12-31 MED ORDER — PHENYLEPHRINE HCL (PRESSORS) 10 MG/ML IV SOLN
INTRAVENOUS | Status: AC
  Filled 2019-12-31: qty 1

## 2019-12-31 MED ORDER — KETAMINE HCL 10 MG/ML IJ SOLN
INTRAMUSCULAR | Status: AC
  Filled 2019-12-31: qty 1

## 2019-12-31 MED ORDER — ENALAPRILAT 1.25 MG/ML IV SOLN
0.6250 mg | Freq: Four times a day (QID) | INTRAVENOUS | Status: DC | PRN
  Filled 2019-12-31: qty 1

## 2019-12-31 MED ORDER — EPHEDRINE 5 MG/ML INJ
INTRAVENOUS | Status: AC
  Filled 2019-12-31: qty 10

## 2019-12-31 MED ORDER — FENTANYL CITRATE (PF) 250 MCG/5ML IJ SOLN
INTRAMUSCULAR | Status: AC
  Filled 2019-12-31: qty 5

## 2019-12-31 MED ORDER — ONDANSETRON HCL 4 MG/2ML IJ SOLN
INTRAMUSCULAR | Status: DC | PRN
  Administered 2019-12-31: 4 mg via INTRAVENOUS

## 2019-12-31 MED ORDER — ESMOLOL HCL 100 MG/10ML IV SOLN
INTRAVENOUS | Status: AC
  Filled 2019-12-31: qty 10

## 2019-12-31 MED ORDER — BUPIVACAINE LIPOSOME 1.3 % IJ SUSP
INTRAMUSCULAR | Status: DC | PRN
  Administered 2019-12-31: 20 mL

## 2019-12-31 MED ORDER — POLYETHYLENE GLYCOL 3350 17 G PO PACK: 17.0000 g | PACK | Freq: Two times a day (BID) | ORAL | Status: DC | PRN

## 2019-12-31 MED ORDER — SUCCINYLCHOLINE CHLORIDE 200 MG/10ML IV SOSY
PREFILLED_SYRINGE | INTRAVENOUS | Status: AC
  Filled 2019-12-31: qty 10

## 2019-12-31 MED ORDER — SODIUM CHLORIDE 0.9 % IV SOLN: 250.0000 mL | INTRAVENOUS | Status: DC | PRN

## 2019-12-31 MED ORDER — MAGIC MOUTHWASH
15.0000 mL | Freq: Four times a day (QID) | ORAL | Status: DC | PRN
  Filled 2019-12-31: qty 15

## 2019-12-31 MED ORDER — ALBUMIN HUMAN 5 % IV SOLN
INTRAVENOUS | Status: AC
  Filled 2019-12-31: qty 250

## 2019-12-31 MED ORDER — PSYLLIUM 95 % PO PACK
1.0000 | PACK | Freq: Every day | ORAL | Status: DC
  Filled 2019-12-31: qty 1

## 2019-12-31 MED ORDER — SODIUM CHLORIDE 0.9 % IR SOLN
Status: DC | PRN
  Administered 2019-12-31: 1000 mL

## 2019-12-31 MED ORDER — PROPOFOL 10 MG/ML IV BOLUS
INTRAVENOUS | Status: DC | PRN
  Administered 2019-12-31: 100 mg via INTRAVENOUS
  Administered 2019-12-31 (×2): 50 mg via INTRAVENOUS

## 2019-12-31 MED ORDER — LACTATED RINGERS IR SOLN
Status: DC | PRN
  Administered 2019-12-31: 1000 mL

## 2019-12-31 MED ORDER — ACETAMINOPHEN 500 MG PO TABS
1000.0000 mg | ORAL_TABLET | ORAL | Status: AC
  Administered 2019-12-31: 1000 mg via ORAL
  Filled 2019-12-31: qty 2

## 2019-12-31 MED ORDER — GABAPENTIN 300 MG PO CAPS
300.0000 mg | ORAL_CAPSULE | ORAL | Status: AC
  Administered 2019-12-31: 300 mg via ORAL
  Filled 2019-12-31: qty 1

## 2019-12-31 MED ORDER — CELECOXIB 200 MG PO CAPS
400.0000 mg | ORAL_CAPSULE | ORAL | Status: AC
  Administered 2019-12-31: 400 mg via ORAL
  Filled 2019-12-31: qty 2

## 2019-12-31 MED ORDER — FENTANYL CITRATE (PF) 250 MCG/5ML IJ SOLN
INTRAMUSCULAR | Status: DC | PRN
Start: 2019-12-31 — End: 2019-12-31
  Administered 2019-12-31: 100 ug via INTRAVENOUS
  Administered 2019-12-31 (×3): 50 ug via INTRAVENOUS

## 2019-12-31 MED ORDER — FENTANYL CITRATE (PF) 100 MCG/2ML IJ SOLN
25.0000 ug | INTRAMUSCULAR | Status: DC | PRN
  Administered 2019-12-31: 50 ug via INTRAVENOUS

## 2019-12-31 MED ORDER — LACTATED RINGERS IV BOLUS: 1000.0000 mL | Freq: Three times a day (TID) | INTRAVENOUS | Status: DC | PRN

## 2019-12-31 MED ORDER — LABETALOL HCL 5 MG/ML IV SOLN
INTRAVENOUS | Status: DC | PRN
  Administered 2019-12-31 (×2): 2.5 mg via INTRAVENOUS

## 2019-12-31 MED ORDER — POLYETHYLENE GLYCOL 3350 17 G PO PACK: 17.0000 g | PACK | Freq: Every day | ORAL | 0 refills | Status: AC | PRN

## 2019-12-31 MED ORDER — BUPIVACAINE-EPINEPHRINE 0.25% -1:200000 IJ SOLN
INTRAMUSCULAR | Status: DC | PRN
  Administered 2019-12-31: 30 mL

## 2019-12-31 MED ORDER — PSYLLIUM 95 % PO PACK: 1.0000 | PACK | Freq: Every day | ORAL | 1 refills | Status: AC

## 2019-12-31 MED ORDER — LIDOCAINE 2% (20 MG/ML) 5 ML SYRINGE
INTRAMUSCULAR | Status: DC | PRN
  Administered 2019-12-31: 40 mg via INTRAVENOUS

## 2019-12-31 MED ORDER — ROCURONIUM BROMIDE 10 MG/ML (PF) SYRINGE
PREFILLED_SYRINGE | INTRAVENOUS | Status: DC | PRN
  Administered 2019-12-31: 10 mg via INTRAVENOUS
  Administered 2019-12-31: 40 mg via INTRAVENOUS

## 2019-12-31 MED ORDER — LIP MEDEX EX OINT
1.0000 "application " | TOPICAL_OINTMENT | Freq: Two times a day (BID) | CUTANEOUS | Status: DC
  Administered 2019-12-31: 1 via TOPICAL

## 2019-12-31 MED ORDER — DIPHENHYDRAMINE HCL 50 MG/ML IJ SOLN: 12.5000 mg | Freq: Four times a day (QID) | INTRAMUSCULAR | Status: DC | PRN

## 2019-12-31 MED ORDER — LACTATED RINGERS IV SOLN: INTRAVENOUS | Status: DC | PRN

## 2019-12-31 MED ORDER — FENTANYL CITRATE (PF) 100 MCG/2ML IJ SOLN
INTRAMUSCULAR | Status: DC | PRN
Start: 2019-12-31 — End: 2019-12-31
  Administered 2019-12-31: 100 ug via INTRAVENOUS

## 2019-12-31 MED ORDER — MIDAZOLAM HCL 5 MG/5ML IJ SOLN
INTRAMUSCULAR | Status: DC | PRN
  Administered 2019-12-31: 1 mg via INTRAVENOUS

## 2019-12-31 MED ORDER — METOPROLOL TARTRATE 5 MG/5ML IV SOLN: 5.0000 mg | Freq: Four times a day (QID) | INTRAVENOUS | Status: DC | PRN

## 2019-12-31 MED ORDER — SODIUM CHLORIDE 0.9% FLUSH: 3.0000 mL | INTRAVENOUS | Status: DC | PRN

## 2019-12-31 MED ORDER — SCOPOLAMINE 1 MG/3DAYS TD PT72
1.0000 | MEDICATED_PATCH | TRANSDERMAL | Status: DC
  Filled 2019-12-31: qty 1

## 2019-12-31 MED ORDER — BUPIVACAINE LIPOSOME 1.3 % IJ SUSP
20.0000 mL | Freq: Once | INTRAMUSCULAR | Status: DC
  Filled 2019-12-31: qty 20

## 2019-12-31 MED ORDER — SUGAMMADEX SODIUM 200 MG/2ML IV SOLN
INTRAVENOUS | Status: DC | PRN
  Administered 2019-12-31: 150 mg via INTRAVENOUS

## 2019-12-31 MED ORDER — PHENYLEPHRINE 40 MCG/ML (10ML) SYRINGE FOR IV PUSH (FOR BLOOD PRESSURE SUPPORT)
PREFILLED_SYRINGE | INTRAVENOUS | Status: AC
  Filled 2019-12-31: qty 20

## 2019-12-31 MED ORDER — ROCURONIUM BROMIDE 10 MG/ML (PF) SYRINGE
PREFILLED_SYRINGE | INTRAVENOUS | Status: AC
  Filled 2019-12-31: qty 10

## 2019-12-31 MED ORDER — MIDAZOLAM HCL 2 MG/2ML IJ SOLN
INTRAMUSCULAR | Status: AC
  Filled 2019-12-31: qty 2

## 2019-12-31 MED ORDER — DEXAMETHASONE SODIUM PHOSPHATE 10 MG/ML IJ SOLN
INTRAMUSCULAR | Status: AC
  Filled 2019-12-31: qty 2

## 2019-12-31 MED ORDER — ESMOLOL HCL 100 MG/10ML IV SOLN
INTRAVENOUS | Status: DC | PRN
  Administered 2019-12-31: 20 mg via INTRAVENOUS
  Administered 2019-12-31: 10 mg via INTRAVENOUS

## 2019-12-31 MED ORDER — AMLODIPINE BESYLATE 5 MG PO TABS: 5.0000 mg | ORAL_TABLET | Freq: Every day | ORAL | 3 refills | Status: AC

## 2019-12-31 MED ORDER — ONDANSETRON HCL 4 MG/2ML IJ SOLN: 4.0000 mg | Freq: Once | INTRAMUSCULAR | Status: DC | PRN

## 2019-12-31 MED ORDER — TRAMADOL HCL 50 MG PO TABS: 50.0000 mg | ORAL_TABLET | Freq: Four times a day (QID) | ORAL | 0 refills | Status: AC | PRN

## 2019-12-31 MED ORDER — ONDANSETRON HCL 4 MG/2ML IJ SOLN
INTRAMUSCULAR | Status: AC
  Filled 2019-12-31: qty 4

## 2019-12-31 MED ORDER — LIDOCAINE 2% (20 MG/ML) 5 ML SYRINGE
INTRAMUSCULAR | Status: AC
  Filled 2019-12-31: qty 5

## 2019-12-31 MED ORDER — SODIUM CHLORIDE 0.9% FLUSH: 3.0000 mL | Freq: Two times a day (BID) | INTRAVENOUS | Status: DC

## 2019-12-31 MED ORDER — SUCCINYLCHOLINE CHLORIDE 200 MG/10ML IV SOSY
PREFILLED_SYRINGE | INTRAVENOUS | Status: DC | PRN
  Administered 2019-12-31: 120 mg via INTRAVENOUS

## 2019-12-31 MED ORDER — ONDANSETRON 4 MG PO TBDP: 4.0000 mg | ORAL_TABLET | Freq: Three times a day (TID) | ORAL | 0 refills | Status: AC | PRN

## 2019-12-31 MED ORDER — DEXAMETHASONE SODIUM PHOSPHATE 10 MG/ML IJ SOLN
INTRAMUSCULAR | Status: DC | PRN
  Administered 2019-12-31: 10 mg via INTRAVENOUS

## 2019-12-31 MED ORDER — PROPOFOL 1000 MG/100ML IV EMUL
INTRAVENOUS | Status: AC
  Filled 2019-12-31: qty 100

## 2019-12-31 MED ORDER — LABETALOL HCL 5 MG/ML IV SOLN
INTRAVENOUS | Status: AC
  Filled 2019-12-31: qty 4

## 2019-12-31 MED ORDER — BISACODYL 10 MG RE SUPP: 10.0000 mg | Freq: Two times a day (BID) | RECTAL | Status: DC | PRN

## 2019-12-31 MED ORDER — PANTOPRAZOLE SODIUM 40 MG PO TBEC: 40.0000 mg | DELAYED_RELEASE_TABLET | Freq: Every day | ORAL | 3 refills | Status: AC

## 2019-12-31 SURGICAL SUPPLY — 50 items
APPLIER CLIP 5 13 M/L LIGAMAX5 (MISCELLANEOUS) ×4
APPLIER CLIP ROT 10 11.4 M/L (STAPLE)
CABLE HIGH FREQUENCY MONO STRZ (ELECTRODE) ×4 IMPLANT
CHLORAPREP W/TINT 26 (MISCELLANEOUS) ×4 IMPLANT
CLIP APPLIE 5 13 M/L LIGAMAX5 (MISCELLANEOUS) ×2 IMPLANT
CLIP APPLIE ROT 10 11.4 M/L (STAPLE) IMPLANT
COVER MAYO STAND STRL (DRAPES) ×8 IMPLANT
COVER SURGICAL LIGHT HANDLE (MISCELLANEOUS) ×8 IMPLANT
COVER WAND RF STERILE (DRAPES) ×4 IMPLANT
DECANTER SPIKE VIAL GLASS SM (MISCELLANEOUS) ×8 IMPLANT
DRAIN CHANNEL 19F RND (DRAIN) IMPLANT
DRAPE C-ARM 42X120 X-RAY (DRAPES) ×8 IMPLANT
DRAPE UTILITY XL STRL (DRAPES) ×4 IMPLANT
DRAPE WARM FLUID 44X44 (DRAPES) ×8 IMPLANT
DRSG TEGADERM 2-3/8X2-3/4 SM (GAUZE/BANDAGES/DRESSINGS) ×16 IMPLANT
DRSG TEGADERM 4X4.75 (GAUZE/BANDAGES/DRESSINGS) ×4 IMPLANT
ELECT REM PT RETURN 15FT ADLT (MISCELLANEOUS) ×8 IMPLANT
ENDOLOOP SUT PDS II  0 18 (SUTURE) ×2
ENDOLOOP SUT PDS II 0 18 (SUTURE) ×1 IMPLANT
EVACUATOR SILICONE 100CC (DRAIN) IMPLANT
GAUZE SPONGE 2X2 8PLY STRL LF (GAUZE/BANDAGES/DRESSINGS) ×2 IMPLANT
GLOVE ECLIPSE 8.0 STRL XLNG CF (GLOVE) ×8 IMPLANT
GLOVE INDICATOR 8.0 STRL GRN (GLOVE) ×8 IMPLANT
GOWN STRL REUS W/TWL XL LVL3 (GOWN DISPOSABLE) ×16 IMPLANT
IRRIG SUCT STRYKERFLOW 2 WTIP (MISCELLANEOUS) ×8
IRRIGATION SUCT STRKRFLW 2 WTP (MISCELLANEOUS) ×4 IMPLANT
KIT BASIN OR (CUSTOM PROCEDURE TRAY) ×8 IMPLANT
KIT TURNOVER KIT A (KITS) IMPLANT
PAD POSITIONING PINK XL (MISCELLANEOUS) ×4 IMPLANT
PENCIL SMOKE EVACUATOR (MISCELLANEOUS) IMPLANT
POUCH RETRIEVAL ECOSAC 10 (ENDOMECHANICALS) ×2 IMPLANT
POUCH RETRIEVAL ECOSAC 10MM (ENDOMECHANICALS) ×2
PROTECTOR NERVE ULNAR (MISCELLANEOUS) IMPLANT
SCISSORS LAP 5X35 DISP (ENDOMECHANICALS) ×8 IMPLANT
SET CHOLANGIOGRAPH MIX (MISCELLANEOUS) ×8 IMPLANT
SET TUBE SMOKE EVAC HIGH FLOW (TUBING) ×8 IMPLANT
SHEARS HARMONIC ACE PLUS 36CM (ENDOMECHANICALS) ×4 IMPLANT
SLEEVE XCEL OPT CAN 5 100 (ENDOMECHANICALS) IMPLANT
SPONGE GAUZE 2X2 8PLY STER LF (GAUZE/BANDAGES/DRESSINGS) ×1
SPONGE GAUZE 2X2 8PLY STRL LF (GAUZE/BANDAGES/DRESSINGS) ×2 IMPLANT
SPONGE GAUZE 2X2 STER 10/PKG (GAUZE/BANDAGES/DRESSINGS) ×2
SUT MNCRL AB 4-0 PS2 18 (SUTURE) ×8 IMPLANT
SUT PDS AB 1 CT1 27 (SUTURE) ×11 IMPLANT
SYR 20ML LL LF (SYRINGE) ×8 IMPLANT
TOWEL OR 17X26 10 PK STRL BLUE (TOWEL DISPOSABLE) ×8 IMPLANT
TOWEL OR NON WOVEN STRL DISP B (DISPOSABLE) ×8 IMPLANT
TRAY LAPAROSCOPIC (CUSTOM PROCEDURE TRAY) ×8 IMPLANT
TROCAR BLADELESS OPT 5 100 (ENDOMECHANICALS) ×8 IMPLANT
TROCAR BLADELESS OPT 5 150 (ENDOMECHANICALS) ×4 IMPLANT
TROCAR XCEL NON-BLD 11X100MML (ENDOMECHANICALS) ×4 IMPLANT

## 2019-12-31 NOTE — Discharge Instructions (Addendum)
[facility]  Please arrive at least 30 min before your appointment to complete your check in paperwork.  If you are unable to arrive 30 min prior to your appointment time we may have to cancel or reschedule you. LAPAROSCOPIC SURGERY: POST OP INSTRUCTIONS Always review your discharge instruction sheet given to you by the facility where your surgery was performed. IF YOU HAVE DISABILITY OR FAMILY LEAVE FORMS, YOU MUST BRING THEM TO THE OFFICE FOR PROCESSING.   DO NOT GIVE THEM TO YOUR DOCTOR.  PAIN CONTROL  1. First take acetaminophen (Tylenol) AND/or ibuprofen (Advil) to control your pain after surgery.  Follow directions on package.  Taking acetaminophen (Tylenol) and/or ibuprofen (Advil) regularly after surgery will help to control your pain and lower the amount of prescription pain medication you may need.  You should not take more than 4,000 mg (4 grams) of acetaminophen (Tylenol) in 24 hours.  You should not take ibuprofen (Advil), aleve, motrin, naprosyn or other NSAIDS if you have a history of stomach ulcers or chronic kidney disease.  2. A prescription for pain medication may be given to you upon discharge.  Take your pain medication as prescribed, if you still have uncontrolled pain after taking acetaminophen (Tylenol) or ibuprofen (Advil). 3. Use ice packs to help control pain. 4. If you need a refill on your pain medication, please contact your pharmacy.  They will contact our office to request authorization. Prescriptions will not be filled after 5pm or on week-ends.  HOME MEDICATIONS 5. Take your usually prescribed medications unless otherwise directed.  DIET 6. You should follow a light diet the first few days after arrival home.  Be sure to include lots of fluids daily. Avoid fatty, fried foods.   CONSTIPATION 7. It is common to experience some constipation after surgery and if you are taking pain medication.  Increasing fluid intake and taking a stool  softener (such as Colace) will usually help or prevent this problem from occurring.  A mild laxative (Milk of Magnesia or Miralax) should be taken according to package instructions if there are no bowel movements after 48 hours.  WOUND/INCISION CARE 8. Most patients will experience some swelling and bruising in the area of the incisions.  Ice packs will help.  Swelling and bruising can take several days to resolve.  9. Unless discharge instructions indicate otherwise, follow guidelines below  a. STERI-STRIPS - you may remove your outer bandages 48 hours after surgery, and you may shower at that time.  You have steri-strips (small skin tapes) in place directly over the incision.  These strips should be left on the skin for 7-10 days.   b. DERMABOND/SKIN GLUE - you may shower in 24 hours.  The glue will flake off over the next 2-3 weeks. 10. Any sutures or staples will be removed at the office during your follow-up visit.  ACTIVITIES 11. You may resume regular (light) daily activities beginning the next day--such as daily self-care, walking, climbing stairs--gradually increasing activities as tolerated.  You may have sexual intercourse when it is comfortable.  Refrain from any heavy lifting or straining until approved by your doctor. a. You may drive when you are no longer taking prescription pain medication, you can comfortably wear a seatbelt, and you can safely maneuver your car and apply brakes.  FOLLOW-UP 12. You should see your doctor in the office for a follow-up appointment approximately 2-3 weeks after your surgery.  You should have been given your post-op/follow-up appointment when   your surgery was scheduled.  If you did not receive a post-op/follow-up appointment, make sure that you call for this appointment within a day or two after you arrive home to insure a convenient appointment time.  OTHER INSTRUCTIONS  WHEN TO CALL YOUR DOCTOR: 1. Fever over 101.0 2. Inability to  urinate 3. Continued bleeding from incision. 4. Increased pain, redness, or drainage from the incision. 5. Increasing abdominal pain  The clinic staff is available to answer your questions during regular business hours.  Please don't hesitate to call and ask to speak to one of the nurses for clinical concerns.  If you have a medical emergency, go to the nearest emergency room or call 911.  A surgeon from [facility] is always on call at the hospital. [address], [place], [place], [place]  [address] ? [address], [place], [place]   [address] [phone] ? [phone] ? FAX [phone]    Cholecystitis  Cholecystitis is inflammation of the gallbladder. It is often called a gallbladder attack. The gallbladder is a pear-shaped organ that lies beneath the liver on the right side of the body. The gallbladder stores bile, which is a fluid that helps the body digest fats. If bile builds up in your gallbladder, your gallbladder becomes inflamed. This condition may occur suddenly. Cholecystitis is a serious condition and requires treatment. What are the causes? The most common cause of this condition is gallstones. Gallstones can block the tube (duct) that carries bile out of your gallbladder. This causes bile to build up. Other causes include:  Damage to the gallbladder due to a decrease in blood flow.  Infections in the bile ducts.  Scars or kinks in the bile ducts.  Tumors in the liver, pancreas, or gallbladder. What increases the risk? You are more likely to develop this condition if:  You have sickle cell disease.  You take birth control pills or use estrogen.  You have alcoholic liver disease.  You have liver cirrhosis.  You have your nutrition delivered through a vein (parenteral nutrition).  You are critically ill.  You do not eat or drink for a long time. This is also called "fasting."  You are obese.  You lose weight too fast.  You are  pregnant.  You have high levels of fat (triglycerides) in the blood.  You have pancreatitis. What are the signs or symptoms? Symptoms of this condition include:  Pain in the abdomen, especially in the upper right area of the abdomen.  Tenderness or bloating in the abdomen.  Nausea.  Vomiting.  Fever.  Chills. How is this diagnosed? This condition is diagnosed with a medical history and physical exam. You may also have other tests, including:  Imaging tests, such as: ? An ultrasound of the gallbladder. ? A CT scan of the abdomen. ? A gallbladder nuclear scan (HIDA scan). This scan allows your health care provider to see the bile moving from your liver to your gallbladder and on to your small intestine. ? MRI.  Blood tests, such as: ? A complete blood count. The white blood cell count may be higher than normal. ? Liver function tests. Certain types of gallstones cause some results to be higher than normal. How is this treated? Treatment may include:  Surgery to remove your gallbladder (cholecystectomy).  Antibiotic medicine, usually through an IV.  Fasting for a certain amount of time.  Giving IV fluids.  Medicine to treat pain or vomiting. Follow these instructions at home:  If you had surgery, follow instructions from your health care provider about home care after the procedure. Medicines   Take over-the-counter and prescription medicines only as told by your health care provider.  If you were prescribed an antibiotic medicine, take it as told by your health care provider. Do not stop taking the antibiotic even if you start to feel better. General instructions  Follow instructions from your health care provider about what to eat or drink. When you are allowed to eat, avoid eating or drinking anything that triggers your symptoms.  Do not lift anything that is heavier than 10 lb (4.5 kg), or the limit that you are told, until your health care provider says that  it is safe.  Do not use any products that contain nicotine or tobacco, such as cigarettes and e-cigarettes. If you need help quitting, ask your health care provider.  Keep all follow-up visits as told by your health care provider. This is important. Contact a health care provider if:  Your pain is not controlled with medicine.  You have a fever. Get help right away if:  Your pain moves to another part of your abdomen or to your back.  You continue to have symptoms or you develop new symptoms even with treatment. Summary  Cholecystitis is inflammation of the gallbladder.  The most common cause of this condition is gallstones. Gallstones can block the tube (duct) that carries bile out of your gallbladder.  Common symptoms are pain in the abdomen, nausea, vomiting, fever, and chills.  This condition is treated with surgery to remove the gallbladder, medicines, fasting, and IV fluids.  Follow your health care provider's instructions for eating and drinking. Avoid eating anything that triggers your symptoms. This information is not intended to replace advice given to you by your health care provider. Make sure you discuss any questions you have with your health care provider. Document Revised: 06/03/2017 Document Reviewed: 06/03/2017 Elsevier Patient Education  [place].

## 2019-12-31 NOTE — Anesthesia Postprocedure Evaluation (Addendum)
Anesthesia Post Note  Patient: Katrina Calhoun  Procedure(s) Performed: LAPAROSCOPIC CHOLECYSTECTOMY SINGLE SITE     Patient location during evaluation: PACU Anesthesia Type: General Level of consciousness: awake and alert and oriented Pain management: pain level controlled Vital Signs Assessment: post-procedure vital signs reviewed and stable Respiratory status: spontaneous breathing, nonlabored ventilation and respiratory function stable Cardiovascular status: blood pressure returned to baseline and stable Postop Assessment: no apparent nausea or vomiting Anesthetic complications: no   No complications documented.                Jagger Beahm A.

## 2019-12-31 NOTE — Transfer of Care (Signed)
Immediate Anesthesia Transfer of Care Note  Patient: Katrina Calhoun  Procedure(s) Performed: LAPAROSCOPIC CHOLECYSTECTOMY SINGLE SITE  Patient Location: PACU  Anesthesia Type:General  Level of Consciousness: awake and alert   Airway & Oxygen Therapy: Patient Spontanous Breathing and Patient connected to face mask oxygen  Post-op Assessment: Report given to RN and Post -op Vital signs reviewed and stable  Post vital signs: Reviewed and stable  Last Vitals:  Vitals Value Taken Time  BP 149/97 12/31/19 1128  Temp    Pulse 89 12/31/19 1129  Resp 13 12/31/19 1129  SpO2 100 % 12/31/19 1129  Vitals shown include unvalidated device data.  Last Pain:  Vitals:   12/31/19 0814  TempSrc:   PainSc: 0-No pain      Patients Stated Pain Goal: 3 (12/52/71 2929)  Complications: No complications documented.

## 2019-12-31 NOTE — Progress Notes (Signed)
Patient discharged to home w/ SO. Given all belongings, instructions. Verbalized understanding of instructions. Escorted to pov via w/c. °

## 2019-12-31 NOTE — Anesthesia Procedure Notes (Signed)
Procedure Name: Intubation Date/Time: 12/31/2019 9:47 AM Performed by: Cynda Familia, CRNA Pre-anesthesia Checklist: Patient identified, Emergency Drugs available, Suction available and Patient being monitored Patient Re-evaluated:Patient Re-evaluated prior to induction Oxygen Delivery Method: Circle System Utilized and Circle system utilized Preoxygenation: Pre-oxygenation with 100% oxygen Induction Type: IV induction, Cricoid Pressure applied and Rapid sequence Laryngoscope Size: Miller and 2 Grade View: Grade I Tube type: Oral Tube size: 7.0 mm Number of attempts: 1 Airway Equipment and Method: Stylet Placement Confirmation: ETT inserted through vocal cords under direct vision,  positive ETCO2 and breath sounds checked- equal and bilateral Secured at: 21 cm Tube secured with: Tape Dental Injury: Teeth and Oropharynx as per pre-operative assessment  Comments: Smooth RSI-- Katrina Calhoun--intubation AM CRNA -- atraumatic-- teeth and mouth as preop-- teeth with decay , chipping esp left upper lateral-- pt reports that 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

## 2019-12-31 NOTE — Anesthesia Procedure Notes (Signed)
Date/Time: 12/31/2019 11:20 AM Performed by: Cynda Familia, CRNA Oxygen Delivery Method: Simple face mask Placement Confirmation: positive ETCO2 and breath sounds checked- equal and bilateral Dental Injury: Teeth and Oropharynx as per pre-operative assessment

## 2019-12-31 NOTE — Op Note (Signed)
12/31/2019  PATIENT:  Katrina Calhoun  54 y.o. female  Patient Care Team: Pcp, No as PCP - General  PRE-OPERATIVE DIAGNOSIS:    Chronic Calculus cholecystitis  Choledocholithiasis status post ERCP  POST-OPERATIVE DIAGNOSIS:   Acute on Chronic Calculus Cholecystitis Choledocholithiasis status post ERCP HEPATIC STEATOSIS  PROCEDURE:  SINGLE SITE Laparoscopic cholecystectomy  WEGDE LIVER RESECTION  SURGEON:  Adin Hector, MD, FACS.  ASSISTANT: OR Staff   ANESTHESIA:    General with endotracheal intubation Local anesthetic as a field block  EBL:  (See Anesthesia Intraoperative Record) Total I/O In: 1000 [I.V.:1000] Out: 20 [Blood:20]  Delay start of Pharmacological VTE agent (>24hrs) due to surgical blood loss or risk of bleeding:  no  DRAINS: None   SPECIMEN: Gallbladder with wedge liver resection en-bloc  DISPOSITION OF SPECIMEN:  PATHOLOGY  COUNTS:  YES  PLAN OF CARE: Admit to inpatient   PATIENT DISPOSITION:  PACU - hemodynamically stable.  INDICATION: Woman history of abdominal pain and elevated liver function test.  Found to have probable cholecystitis and choledocholithiasis.  Admitted.  ERCP done with excellent common bile duct stones.  Improvement in liver function test.  Recommendation made for interval cholecystectomy this hospitalization.  The anatomy & physiology of hepatobiliary & pancreatic function was discussed.  The pathophysiology of gallbladder dysfunction was discussed.  Natural history risks without surgery was discussed.   I feel the risks of no intervention will lead to serious problems that outweigh the operative risks; therefore, I recommended cholecystectomy to remove the pathology.  I explained laparoscopic techniques with possible need for an open approach.  Probable cholangiogram to evaluate the bilary tract was explained as well.    Risks such as bleeding, infection, abscess, leak, injury to other organs, need for further treatment,  heart attack, death, and other risks were discussed.  I noted a good likelihood this will help address the problem.  Possibility that this will not correct all abdominal symptoms was explained.  Goals of post-operative recovery were discussed as well.  We will work to minimize complications.  An educational handout further explaining the pathology and treatment options was given as well.  Questions were answered.  The patient expresses understanding & wishes to proceed with surgery.  OR FINDINGS: White gallbladder wall with thickening and edema consistent with acute on chronic cholecystitis.  Very intrahepatic gallbladder with tongues of liver wrapping around it.  Required en bloc resection of part of liver.  No cholangiogram done given ERCP done yesterday in good critical view intraoperatively.  Liver: Fatty steatohepatitis  DESCRIPTION:   The patient was identified & brought in the operating room. The patient was positioned supine with arms tucked. SCDs were active during the entire case. The patient underwent general anesthesia without any difficulty.  The abdomen was prepped and draped in a sterile fashion. A Surgical Timeout confirmed our plan.  I made a transverse curvilinear incision through the superior umbilical fold.  I placed a 57mm long port through the supraumbilical fascia using a modified Hassan cutdown technique with umbilical stalk fascial countertraction. I began carbon dioxide insufflation.  No change in end tidal CO2 measurement.   Camera inspection revealed no injury. There were no adhesions to the anterior abdominal wall supraumbilically.  I proceeded to continue with single site technique. I placed a #5 port in left upper aspect of the wound. I placed a 5 mm atraumatic grasper in the right inferior aspect of the wound.  I turned attention to the right upper quadrant.  Gallbladder was rather intrahepatic and underneath the liver but I was able to grasp the gallbladder fundus &  elevate it cephalad. I freed adhesions to the ventral surface of the gallbladder off carefully.  I freed the peritoneal coverings between the gallbladder and the liver on the posteriolateral and anteriomedial walls. I alternated between Harmonic & blunt Maryland dissection to help get a good critical view of the cystic artery and cystic duct.  did further dissection to free 80%of the gallbladder off the liver bed to get a good critical view of the infundibulum and cystic duct. I dissected out the cystic artery; and, after getting a good 360 view, ligated the anterior & posterior branches of the cystic artery close on the infundibulum using the Harmonic ultrasonic dissection.  Clip placed on the dominant anterior cystic duct artery  I skeletonized the cystic duct.  It was still thickened and edematous.  Therefore I decided to ligate with Endoloops.  I freed the gallbladder from the main attachments to the liver.  It was densely adherent to the left tongue of the liver on the gallbladder inside the doing of wide resection of a triangular tongue shaped piece of liver densely adherent to the gallbladder en bloc.  I assured hemostasis on the liver.  With this the gallbladder was completely freed and the only thing that remained attaching was at the cystic duct.  I used a 0 PDS Endoloop to lasso around the gallbladder and ligate the cystic duct x2 with 0 PDS Endoloops to good result.  Transected at the infundibulum/cystic duct junction using harmonic scalpel.  I placed the gallbladder inside and ecosac under laparoscopic visualization.  I did copious irrigation and focused spatula tip cautery on the liver bed to assure hemostasis.  I inspected the rest of the abdomen & detected no injury nor bleeding elsewhere.  I removed the gallbladder out the supraumbilical fascia.  I had to open the fascia 3 cm transversely since it was very thickened.  I closed the fascia transversely using #1 PDS running suture from each  corner. I closed the skin using 4-0 monocryl stitch.  Sterile dressing was applied. The patient was extubated & arrived in the PACU in stable condition..  I had discussed postoperative care with the patient in the holding area. I discussed operative findings, updated the patient's status, discussed probable steps to recovery, and gave postoperative recommendations to the patient's significant other.  Recommendations were made.  Questions were answered.  He expressed understanding & appreciation.  Adin Hector, M.D., F.A.C.S. Gastrointestinal and Minimally Invasive Surgery [facility] [address] [place], [place] [address] [phone] Main / Paging  12/31/2019 11:47 AM

## 2019-12-31 NOTE — Interval H&P Note (Signed)
History and Physical Interval Note:  12/31/2019 9:19 AM  Katrina Calhoun  has presented today for surgery, with the diagnosis of cholelithiasis.  The various methods of treatment have been discussed with the patient and family. After consideration of risks, benefits and other options for treatment, the patient has consented to  Procedure(s): LAPAROSCOPIC CHOLECYSTECTOMY WITH INTRAOPERATIVE CHOLANGIOGRAM (N/A) as a surgical intervention.  The patient's history has been reviewed, patient examined, no change in status, stable for surgery.  I have reviewed the patient's chart and labs.  Questions were answered to the patient's satisfaction.    I have re-reviewed the the patient's records, history, medications, and allergies.  I have re-examined the patient.  I again discussed intraoperative plans and goals of post-operative recovery.  The patient agrees to proceed.  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 No pertinent surgical history.  Social History   Socioeconomic History   Marital status: Significant Other    Spouse name: Not on file   Number of children: Not on file   Years of education: Not on file   Highest education level: Not on file  Occupational History   Not on file  Tobacco Use   Smoking status: Former Smoker   Smokeless tobacco: Never Used  Substance and Sexual Activity   Alcohol use: Not on file   Drug use: Not on file   Sexual activity: Not on file  Other Topics Concern   Not on file  Social  History Narrative   Not on file   Social Determinants of Health   Financial Resource Strain:    Difficulty of Paying Living Expenses: Not on file  Food Insecurity:    Worried About [place] in the Last Year: Not on file   Ran Out of Food in the Last Year: Not on file  Transportation Needs:    Lack of Transportation (Medical): Not on file   Lack of Transportation (Non-Medical): Not on file  Physical Activity:    Days of Exercise per Week: Not on file   Minutes of Exercise per Session: Not on file  Stress:    Feeling of Stress : Not on file  Social Connections:    Frequency of Communication with Friends and Family: Not on file   Frequency of Social Gatherings with Friends and Family: Not on file   Attends Religious Services: Not on file   Active Member of Clubs or Organizations: Not on file   Attends Archivist Meetings: Not on file   Marital Status: Not on file  Intimate Partner Violence:    Fear of Current or Ex-Partner: Not on file   Emotionally Abused: Not on file   Physically Abused: Not on file   Sexually Abused: Not on file    History reviewed. No pertinent family history.  Medications Prior to Admission  Medication Sig Dispense Refill Last Dose   Aclidinium Bromide (TUDORZA PRESSAIR) 400 MCG/ACT AEPB Inhale 400 mcg into the lungs every 12 (twelve) hours.  12/28/2019 at Unknown time   albuterol (VENTOLIN HFA) 108 (90 Base) MCG/ACT inhaler Inhale 2 puffs into the lungs every 6 (six) hours as needed for wheezing.   12/27/2019 at Unknown time   amLODipine (NORVASC) 5 MG tablet Take 5 mg by mouth daily.   12/27/2019 at Unknown time   Fluticasone-Salmeterol (ADVAIR) 250-50 MCG/DOSE AEPB Inhale 1 puff into the lungs every 12 (twelve) hours.   12/28/2019 at Unknown time   Ipratropium-Albuterol (COMBIVENT) 20-100 MCG/ACT AERS respimat Inhale 2 puffs into the lungs every 4 (four) hours as needed for wheezing or shortness of breath.    12/27/2019 at Unknown  time    Current Facility-Administered Medications  Medication Dose Route Frequency Provider Last Rate Last Admin   0.9 %  sodium chloride infusion   Intravenous Continuous Irene Shipper, MD 100 mL/hr at 12/31/19 0200 Rate Verify at 12/31/19 0200   [MAR Hold] albuterol (PROVENTIL) (2.5 MG/3ML) 0.083% nebulizer solution 2.5 mg  2.5 mg Nebulization Q6H PRN Irene Shipper, MD       [facility] Hold] amLODipine (NORVASC) tablet 5 mg  5 mg Oral Daily Irene Shipper, MD   5 mg at 12/29/19 0954   [MAR Hold] HYDROmorphone (DILAUDID) injection 1 mg  1 mg Intravenous Q4H PRN Irene Shipper, MD   1 mg at 12/31/19 0732   [MAR Hold] mometasone-formoterol (DULERA) 200-5 MCG/ACT inhaler 2 puff  2 puff Inhalation BID Irene Shipper, MD       [facility] Hold] ondansetron [facility] [place] [facility]) injection 4 mg  4 mg Intravenous Q8H PRN Irene Shipper, MD       [MAR Hold] pantoprazole (PROTONIX) EC tablet 40 mg  40 mg Oral Q0600 Irene Shipper, MD   40 mg at 12/31/19 0623   [MAR Hold] piperacillin-tazobactam (ZOSYN) IVPB 3.375 g  3.375 g Intravenous Q8H Irene Shipper, MD 12.5 mL/hr at 12/31/19 0621 3.375 g at 12/31/19 0621   [MAR Hold] umeclidinium bromide (INCRUSE ELLIPTA) 62.5 MCG/INH 1 puff  1 puff Inhalation Daily Irene Shipper, MD         No Known Allergies  BP (!) 142/76   Pulse 85   Temp 97.6 F (36.4 C) (Oral)   Resp 17   Ht 5\' 3"  (1.6 m)   Wt 52.2 kg   SpO2 100%   BMI 20.37 kg/m   Labs: Results for orders placed or performed during the hospital encounter of 12/28/19 (from the past 48 hour(s))  Resp Panel by RT-PCR (Flu A&B, Covid) Nasopharyngeal Swab     Status: None   Collection Time: 12/29/19  3:55 PM   Specimen: Nasopharyngeal Swab; Nasopharyngeal(NP) swabs in vial transport medium  Result Value Ref Range   SARS Coronavirus 2 by RT PCR NEGATIVE NEGATIVE    Comment: (NOTE) SARS-CoV-2 target nucleic acids are NOT DETECTED.  The SARS-CoV-2 RNA is generally detectable in upper respiratory specimens during the acute phase  of infection. The lowest concentration of SARS-CoV-2 viral copies this assay can detect is 138 copies/mL. A negative result does not preclude SARS-Cov-2 infection and should not be used as the sole basis for treatment or other patient management decisions. A negative result may occur with  improper specimen collection/handling, submission of specimen other than nasopharyngeal swab, presence of viral mutation(s) within the areas targeted by this assay, and inadequate number of viral copies(<138 copies/mL). A negative result must be combined with clinical observations, patient history, and epidemiological information. The expected result is Negative.  Fact Sheet for  Patients:  EntrepreneurPulse.com.au  Fact Sheet for Healthcare Providers:  IncredibleEmployment.be  This test is no t yet approved or cleared by the [place] FDA and  has been authorized for detection and/or diagnosis of SARS-CoV-2 by FDA under an Emergency Use Authorization (EUA). This EUA will remain  in effect (meaning this test can be used) for the duration of the COVID-19 declaration under Section 564(b)(1) of the Act, 21 U.S.C.section 360bbb-3(b)(1), unless the authorization is terminated  or revoked sooner.       Influenza A by PCR NEGATIVE NEGATIVE   Influenza B by PCR NEGATIVE NEGATIVE    Comment: (NOTE) The Xpert Xpress SARS-CoV-2/FLU/RSV plus assay is intended as an aid in the diagnosis of influenza from Nasopharyngeal swab specimens and should not be used as a sole basis for treatment. Nasal washings and aspirates are unacceptable for Xpert Xpress SARS-CoV-2/FLU/RSV testing.  Fact Sheet for Patients: EntrepreneurPulse.com.au  Fact Sheet for Healthcare Providers: IncredibleEmployment.be  This test is not yet approved or cleared by the [place] FDA and has been authorized for detection and/or diagnosis of SARS-CoV-2 by FDA  under an Emergency Use Authorization (EUA). This EUA will remain in effect (meaning this test can be used) for the duration of the COVID-19 declaration under Section 564(b)(1) of the Act, 21 U.S.C. section 360bbb-3(b)(1), unless the authorization is terminated or revoked.  Performed at [facility], [place] [address] [place], [place] [address]   Comprehensive metabolic panel     Status: Abnormal   Collection Time: 12/30/19  3:03 AM  Result Value Ref Range   Sodium 135 135 - 145 mmol/L   Potassium 3.2 (L) 3.5 - 5.1 mmol/L   Chloride 103 98 - 111 mmol/L   CO2 24 22 - 32 mmol/L   Glucose, Bld 115 (H) 70 - 99 mg/dL    Comment: Glucose reference range applies only to samples taken after fasting for at least 8 hours.   BUN <5 (L) 6 - 20 mg/dL   Creatinine, Ser 0.41 (L) 0.44 - 1.00 mg/dL   Calcium 8.4 (L) 8.9 - 10.3 mg/dL   Total Protein 5.6 (L) 6.5 - 8.1 g/dL   Albumin 2.4 (L) 3.5 - 5.0 g/dL   AST 57 (H) 15 - 41 U/L   ALT 87 (H) 0 - 44 U/L   Alkaline Phosphatase 992 (H) 38 - 126 U/L   Total Bilirubin 0.8 0.3 - 1.2 mg/dL   GFR, Estimated >60 >60 mL/min    Comment: (NOTE) Calculated using the CKD-EPI Creatinine Equation (2021)    Anion gap 8 5 - 15    Comment: Performed at [facility], [place] [address] [place], [place] [address]  CBC     Status: Abnormal   Collection Time: 12/30/19  3:03 AM  Result Value Ref Range   WBC 8.8 4.0 - 10.5 K/uL   RBC 2.84 (L) 3.87 - 5.11 MIL/uL   Hemoglobin 9.3 (L) 12.0 - 15.0 g/dL   HCT 28.2 (L) 36 - 46 %   MCV 99.3 80.0 - 100.0 fL   MCH 32.7 26.0 - 34.0 pg   MCHC 33.0 30.0 - 36.0 g/dL   RDW 12.9 11.5 - 15.5 %   Platelets 357 150 - 400 K/uL   nRBC 0.0 0.0 - 0.2 %    Comment: Performed at [facility], [place] [address] [place], [place] [address]  Comprehensive metabolic panel     Status: Abnormal   Collection Time: 12/31/19  3:28 AM  Result Value Ref Range   Sodium 136 135 - 145 mmol/L    Potassium 3.6 3.5 - 5.1 mmol/L   Chloride 105 98 - 111 mmol/L   CO2 23 22 - 32 mmol/L   Glucose, Bld 137 (H) 70 - 99 mg/dL    Comment: Glucose reference range applies only to samples taken after fasting for at least 8 hours.   BUN 5 (L) 6 - 20 mg/dL   Creatinine, Ser 0.50 0.44 - 1.00 mg/dL   Calcium 8.6 (L) 8.9 - 10.3 mg/dL   Total Protein 6.0 (L) 6.5 - 8.1 g/dL   Albumin 2.6 (L) 3.5 - 5.0 g/dL   AST 26 15 - 41 U/L   ALT 66 (H) 0 - 44 U/L   Alkaline Phosphatase 807 (H) 38 - 126 U/L   Total Bilirubin 0.6 0.3 - 1.2 mg/dL   GFR, Estimated >60 >60 mL/min    Comment: (NOTE) Calculated using the CKD-EPI Creatinine Equation (2021)    Anion gap 8 5 - 15    Comment: Performed at [facility], [place] [address], [place], [place] [address]  MRSA PCR Screening     Status: None   Collection Time: 12/31/19  6:13 AM   Specimen: Nasopharyngeal  Result Value Ref Range   MRSA by PCR NEGATIVE NEGATIVE    Comment:        The GeneXpert MRSA Assay (FDA approved for NASAL specimens only), is one component of a comprehensive MRSA colonization surveillance program. It is not intended to diagnose MRSA infection nor to guide or monitor treatment for MRSA infections. Performed at [facility], [place] [address], [place], [place] [address]     Imaging / Studies: MR 3D Recon At Scanner  Result Date: 12/29/2019 CLINICAL DATA:  Abdominal pain. Elevated bilirubin. Evaluate for biliary obstruction EXAM: MRI ABDOMEN WITHOUT AND WITH CONTRAST (INCLUDING MRCP) TECHNIQUE: Multiplanar multisequence MR imaging of the abdomen was performed both before and after the administration of intravenous contrast. Heavily T2-weighted images of the biliary and pancreatic ducts were obtained, and three-dimensional MRCP images were rendered by post processing. CONTRAST:  12mL GADAVIST GADOBUTROL 1 MMOL/ML IV SOLN COMPARISON:  None. FINDINGS: Lower chest: Lung bases are clear. Hepatobiliary: No  intrahepatic biliary duct dilatation. The common hepatic duct measures 10 mm. The common bile duct measures 12 mm. There is a filling defect within distal common bile duct measuring 3 mm on image 22/series 3. The distal common bile duct stone is also well seen on the MR P sequences (series 13) There multiple gallstones within the gallbladder. No gallbladder distension. Pancreas: No pancreatic duct dilatation. No significant peripancreatic inflammation. The distal common bile duct stone is well seen on coronal image 15/5) Spleen: Normal spleen Adrenals/urinary tract: Adrenal glands and kidneys normal. Stomach/Bowel: Stomach, small bowel, appendix, and cecum are normal. The colon and rectosigmoid colon are normal. Vascular/Lymphatic: Abdominal aorta is normal caliber. No periportal or retroperitoneal adenopathy. No pelvic adenopathy. Other: No free fluid. Musculoskeletal: No aggressive osseous lesion. IMPRESSION: 1. Distal common bile duct stone (choledocholithiasis) with moderate extrahepatic duct dilatation. 2. Cholelithiasis without evidence of acute cholecystitis. 3. No pancreatic duct dilatation or peripancreatic inflammation. Findings conveyed to Dr. Kennon Holter by Dr. Derrel Nip on 11 19 2021 at 6:30 p.m. Electronically Signed   By: Suzy Bouchard M.D.   On: 12/29/2019 09:57   DG ERCP BILIARY & PANCREATIC DUCTS  Result Date: 12/30/2019 CLINICAL DATA:  Common bile duct dilatation. EXAM: ERCP TECHNIQUE: Multiple spot images obtained  with the fluoroscopic device and submitted for interpretation post-procedure. FLUOROSCOPY TIME:  Fluoroscopy Time:  8 minutes, 22 seconds COMPARISON:  MRCP 12/28/2019 FINDINGS: Retrograde cholangiogram demonstrates a dilated extrahepatic biliary system. Round filling defect is compatible with a stone. Common bile duct was cannulated. Balloon sweep was performed. IMPRESSION: Dilated common bile duct with choledocholithiasis.  Stone removal. These images were submitted for radiologic  interpretation only. Please see the procedural report for the amount of contrast and the fluoroscopy time utilized. Electronically Signed   By: Markus Daft M.D.   On: 12/30/2019 13:44   MR ABDOMEN MRCP W WO CONTAST  Result Date: 12/29/2019 CLINICAL DATA:  Abdominal pain. Elevated bilirubin. Evaluate for biliary obstruction EXAM: MRI ABDOMEN WITHOUT AND WITH CONTRAST (INCLUDING MRCP) TECHNIQUE: Multiplanar multisequence MR imaging of the abdomen was performed both before and after the administration of intravenous contrast. Heavily T2-weighted images of the biliary and pancreatic ducts were obtained, and three-dimensional MRCP images were rendered by post processing. CONTRAST:  23mL GADAVIST GADOBUTROL 1 MMOL/ML IV SOLN COMPARISON:  None. FINDINGS: Lower chest: Lung bases are clear. Hepatobiliary: No intrahepatic biliary duct dilatation. The common hepatic duct measures 10 mm. The common bile duct measures 12 mm. There is a filling defect within distal common bile duct measuring 3 mm on image 22/series 3. The distal common bile duct stone is also well seen on the MR P sequences (series 13) There multiple gallstones within the gallbladder. No gallbladder distension. Pancreas: No pancreatic duct dilatation. No significant peripancreatic inflammation. The distal common bile duct stone is well seen on coronal image 15/5) Spleen: Normal spleen Adrenals/urinary tract: Adrenal glands and kidneys normal. Stomach/Bowel: Stomach, small bowel, appendix, and cecum are normal. The colon and rectosigmoid colon are normal. Vascular/Lymphatic: Abdominal aorta is normal caliber. No periportal or retroperitoneal adenopathy. No pelvic adenopathy. Other: No free fluid. Musculoskeletal: No aggressive osseous lesion. IMPRESSION: 1. Distal common bile duct stone (choledocholithiasis) with moderate extrahepatic duct dilatation. 2. Cholelithiasis without evidence of acute cholecystitis. 3. No pancreatic duct dilatation or peripancreatic  inflammation. Findings conveyed to Dr. Kennon Holter by Dr. Derrel Nip on 11 19 2021 at 6:30 p.m. Electronically Signed   By: Suzy Bouchard M.D.   On: 12/29/2019 09:57     .Adin Hector, M.D., F.A.C.S. Gastrointestinal and Minimally Invasive Surgery [facility] [address] [place], [place] [address] [phone] Main / Paging  12/31/2019 9:21 AM    I have re-reviewed the the patient's records, history, medications, and allergies.  I have re-examined the patient.  I again discussed intraoperative plans and goals of post-operative recovery.  The patient agrees to proceed.  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 No pertinent surgical history.  Social History   Socioeconomic History   Marital status: Significant Other    Spouse name: Not on file   Number of children: Not on file   Years of education: Not on file   Highest education level: Not on file  Occupational History  Not on file  Tobacco Use   Smoking status: Former Smoker   Smokeless tobacco: Never Used  Substance and Sexual Activity   Alcohol use: Not on file   Drug use: Not on file   Sexual activity: Not on file  Other Topics Concern   Not on file  Social History Narrative   Not on file   Social Determinants of Health   Financial Resource Strain:    Difficulty of Paying Living Expenses: Not on file  Food Insecurity:    Worried About [place] in the Last Year: Not on file   Ran Out of Food in  the Last Year: Not on file  Transportation Needs:    Lack of Transportation (Medical): Not on file   Lack of Transportation (Non-Medical): Not on file  Physical Activity:    Days of Exercise per Week: Not on file   Minutes of Exercise per Session: Not on file  Stress:    Feeling of Stress : Not on file  Social Connections:    Frequency of Communication with Friends and Family: Not on file   Frequency of Social Gatherings with Friends and Family: Not on file   Attends Religious Services: Not on file   Active Member of Clubs or Organizations: Not on file   Attends Archivist Meetings: Not on file   Marital Status: Not on file  Intimate Partner Violence:    Fear of Current or Ex-Partner: Not on file   Emotionally Abused: Not on file   Physically Abused: Not on file   Sexually Abused: Not on file    History reviewed. No pertinent family history.  Medications Prior to Admission  Medication Sig Dispense Refill Last Dose   Aclidinium Bromide (TUDORZA PRESSAIR) 400 MCG/ACT AEPB Inhale 400 mcg into the lungs every 12 (twelve) hours.   12/28/2019 at Unknown time   albuterol (VENTOLIN HFA) 108 (90 Base) MCG/ACT inhaler Inhale 2 puffs into the lungs every 6 (six) hours as needed for wheezing.   12/27/2019 at Unknown time   amLODipine (NORVASC) 5 MG tablet Take 5 mg by mouth daily.   12/27/2019 at Unknown time   Fluticasone-Salmeterol (ADVAIR) 250-50 MCG/DOSE AEPB Inhale 1 puff into the lungs every 12 (twelve) hours.   12/28/2019 at Unknown time   Ipratropium-Albuterol (COMBIVENT) 20-100 MCG/ACT AERS respimat Inhale 2 puffs into the lungs every 4 (four) hours as needed for wheezing or shortness of breath.    12/27/2019 at Unknown time    Current Facility-Administered Medications  Medication Dose Route Frequency Provider Last Rate Last Admin   0.9 %  sodium chloride infusion   Intravenous Continuous Irene Shipper, MD 100 mL/hr at 12/31/19 0200 Rate Verify at 12/31/19 0200   [MAR  Hold] albuterol (PROVENTIL) (2.5 MG/3ML) 0.083% nebulizer solution 2.5 mg  2.5 mg Nebulization Q6H PRN Irene Shipper, MD       [facility] Hold] amLODipine (NORVASC) tablet 5 mg  5 mg Oral Daily Irene Shipper, MD   5 mg at 12/29/19 0954   [MAR Hold] HYDROmorphone (DILAUDID) injection 1 mg  1 mg Intravenous Q4H PRN Irene Shipper, MD   1 mg at 12/31/19 0732   [MAR Hold] mometasone-formoterol (DULERA) 200-5 MCG/ACT inhaler 2 puff  2 puff Inhalation BID Irene Shipper, MD       [facility] Hold] ondansetron [facility]) injection 4 mg  4 mg Intravenous Q8H PRN Irene Shipper, MD       [facility] Hold] pantoprazole (  PROTONIX) EC tablet 40 mg  40 mg Oral Q0600 Irene Shipper, MD   40 mg at 12/31/19 0623   [MAR Hold] piperacillin-tazobactam (ZOSYN) IVPB 3.375 g  3.375 g Intravenous Q8H Irene Shipper, MD 12.5 mL/hr at 12/31/19 0621 3.375 g at 12/31/19 0621   [MAR Hold] umeclidinium bromide (INCRUSE ELLIPTA) 62.5 MCG/INH 1 puff  1 puff Inhalation Daily Irene Shipper, MD         No Known Allergies  BP (!) 142/76   Pulse 85   Temp 97.6 F (36.4 C) (Oral)   Resp 17   Ht 5\' 3"  (1.6 m)   Wt 52.2 kg   SpO2 100%   BMI 20.37 kg/m   Labs: Results for orders placed or performed during the hospital encounter of 12/28/19 (from the past 48 hour(s))  Resp Panel by RT-PCR (Flu A&B, Covid) Nasopharyngeal Swab     Status: None   Collection Time: 12/29/19  3:55 PM   Specimen: Nasopharyngeal Swab; Nasopharyngeal(NP) swabs in vial transport medium  Result Value Ref Range   SARS Coronavirus 2 by RT PCR NEGATIVE NEGATIVE    Comment: (NOTE) SARS-CoV-2 target nucleic acids are NOT DETECTED.  The SARS-CoV-2 RNA is generally detectable in upper respiratory specimens during the acute phase of infection. The lowest concentration of SARS-CoV-2 viral copies this assay can detect is 138 copies/mL. A negative result does not preclude SARS-Cov-2 infection and should not be used as the sole basis for treatment or other patient management  decisions. A negative result may occur with  improper specimen collection/handling, submission of specimen other than nasopharyngeal swab, presence of viral mutation(s) within the areas targeted by this assay, and inadequate number of viral copies(<138 copies/mL). A negative result must be combined with clinical observations, patient history, and epidemiological information. The expected result is Negative.  Fact Sheet for Patients:  EntrepreneurPulse.com.au  Fact Sheet for Healthcare Providers:  IncredibleEmployment.be  This test is no t yet approved or cleared by the [place] FDA and  has been authorized for detection and/or diagnosis of SARS-CoV-2 by FDA under an Emergency Use Authorization (EUA). This EUA will remain  in effect (meaning this test can be used) for the duration of the COVID-19 declaration under Section 564(b)(1) of the Act, 21 U.S.C.section 360bbb-3(b)(1), unless the authorization is terminated  or revoked sooner.       Influenza A by PCR NEGATIVE NEGATIVE   Influenza B by PCR NEGATIVE NEGATIVE    Comment: (NOTE) The Xpert Xpress SARS-CoV-2/FLU/RSV plus assay is intended as an aid in the diagnosis of influenza from Nasopharyngeal swab specimens and should not be used as a sole basis for treatment. Nasal washings and aspirates are unacceptable for Xpert Xpress SARS-CoV-2/FLU/RSV testing.  Fact Sheet for Patients: EntrepreneurPulse.com.au  Fact Sheet for Healthcare Providers: IncredibleEmployment.be  This test is not yet approved or cleared by the [place] FDA and has been authorized for detection and/or diagnosis of SARS-CoV-2 by FDA under an Emergency Use Authorization (EUA). This EUA will remain in effect (meaning this test can be used) for the duration of the COVID-19 declaration under Section 564(b)(1) of the Act, 21 U.S.C. section 360bbb-3(b)(1), unless the authorization  is terminated or revoked.  Performed at [facility], [place] [address], [place], [place] [address]   Comprehensive metabolic panel     Status: Abnormal   Collection Time: 12/30/19  3:03 AM  Result Value Ref Range   Sodium 135 135 - 145 mmol/L   Potassium 3.2 (  L) 3.5 - 5.1 mmol/L   Chloride 103 98 - 111 mmol/L   CO2 24 22 - 32 mmol/L   Glucose, Bld 115 (H) 70 - 99 mg/dL    Comment: Glucose reference range applies only to samples taken after fasting for at least 8 hours.   BUN <5 (L) 6 - 20 mg/dL   Creatinine, Ser 0.41 (L) 0.44 - 1.00 mg/dL   Calcium 8.4 (L) 8.9 - 10.3 mg/dL   Total Protein 5.6 (L) 6.5 - 8.1 g/dL   Albumin 2.4 (L) 3.5 - 5.0 g/dL   AST 57 (H) 15 - 41 U/L   ALT 87 (H) 0 - 44 U/L   Alkaline Phosphatase 992 (H) 38 - 126 U/L   Total Bilirubin 0.8 0.3 - 1.2 mg/dL   GFR, Estimated >60 >60 mL/min    Comment: (NOTE) Calculated using the CKD-EPI Creatinine Equation (2021)    Anion gap 8 5 - 15    Comment: Performed at [facility], [place] [address], [place], [place] [address]  CBC     Status: Abnormal   Collection Time: 12/30/19  3:03 AM  Result Value Ref Range   WBC 8.8 4.0 - 10.5 K/uL   RBC 2.84 (L) 3.87 - 5.11 MIL/uL   Hemoglobin 9.3 (L) 12.0 - 15.0 g/dL   HCT 28.2 (L) 36 - 46 %   MCV 99.3 80.0 - 100.0 fL   MCH 32.7 26.0 - 34.0 pg   MCHC 33.0 30.0 - 36.0 g/dL   RDW 12.9 11.5 - 15.5 %   Platelets 357 150 - 400 K/uL   nRBC 0.0 0.0 - 0.2 %    Comment: Performed at [facility], [place] [address] [place], [place] [address]  Comprehensive metabolic panel     Status: Abnormal   Collection Time: 12/31/19  3:28 AM  Result Value Ref Range   Sodium 136 135 - 145 mmol/L   Potassium 3.6 3.5 - 5.1 mmol/L   Chloride 105 98 - 111 mmol/L   CO2 23 22 - 32 mmol/L   Glucose, Bld 137 (H) 70 - 99 mg/dL    Comment: Glucose reference range applies only to samples taken after fasting for at least 8 hours.   BUN 5 (L) 6 - 20 mg/dL    Creatinine, Ser 0.50 0.44 - 1.00 mg/dL   Calcium 8.6 (L) 8.9 - 10.3 mg/dL   Total Protein 6.0 (L) 6.5 - 8.1 g/dL   Albumin 2.6 (L) 3.5 - 5.0 g/dL   AST 26 15 - 41 U/L   ALT 66 (H) 0 - 44 U/L   Alkaline Phosphatase 807 (H) 38 - 126 U/L   Total Bilirubin 0.6 0.3 - 1.2 mg/dL   GFR, Estimated >60 >60 mL/min    Comment: (NOTE) Calculated using the CKD-EPI Creatinine Equation (2021)    Anion gap 8 5 - 15    Comment: Performed at [facility], [place] [address] [place], [place] [address]  MRSA PCR Screening     Status: None   Collection Time: 12/31/19  6:13 AM   Specimen: Nasopharyngeal  Result Value Ref Range   MRSA by PCR NEGATIVE NEGATIVE    Comment:        The GeneXpert MRSA Assay (FDA approved for NASAL specimens only), is one component of a comprehensive MRSA colonization surveillance program. It is not intended to diagnose MRSA infection nor to guide or monitor treatment for MRSA infections. Performed at [facility],  [place] [address] [place], [place] [address]     Imaging / Studies: MR 3D Recon At Scanner  Result Date: 12/29/2019 CLINICAL DATA:  Abdominal pain. Elevated bilirubin. Evaluate for biliary obstruction EXAM: MRI ABDOMEN WITHOUT AND WITH CONTRAST (INCLUDING MRCP) TECHNIQUE: Multiplanar multisequence MR imaging of the abdomen was performed both before and after the administration of intravenous contrast. Heavily T2-weighted images of the biliary and pancreatic ducts were obtained, and three-dimensional MRCP images were rendered by post processing. CONTRAST:  11mL GADAVIST GADOBUTROL 1 MMOL/ML IV SOLN COMPARISON:  None. FINDINGS: Lower chest: Lung bases are clear. Hepatobiliary: No intrahepatic biliary duct dilatation. The common hepatic duct measures 10 mm. The common bile duct measures 12 mm. There is a filling defect within distal common bile duct measuring 3 mm on image 22/series 3. The distal common bile duct stone is also well  seen on the MR P sequences (series 13) There multiple gallstones within the gallbladder. No gallbladder distension. Pancreas: No pancreatic duct dilatation. No significant peripancreatic inflammation. The distal common bile duct stone is well seen on coronal image 15/5) Spleen: Normal spleen Adrenals/urinary tract: Adrenal glands and kidneys normal. Stomach/Bowel: Stomach, small bowel, appendix, and cecum are normal. The colon and rectosigmoid colon are normal. Vascular/Lymphatic: Abdominal aorta is normal caliber. No periportal or retroperitoneal adenopathy. No pelvic adenopathy. Other: No free fluid. Musculoskeletal: No aggressive osseous lesion. IMPRESSION: 1. Distal common bile duct stone (choledocholithiasis) with moderate extrahepatic duct dilatation. 2. Cholelithiasis without evidence of acute cholecystitis. 3. No pancreatic duct dilatation or peripancreatic inflammation. Findings conveyed to Dr. Kennon Holter by Dr. Derrel Nip on 11 19 2021 at 6:30 p.m. Electronically Signed   By: Suzy Bouchard M.D.   On: 12/29/2019 09:57   DG ERCP BILIARY & PANCREATIC DUCTS  Result Date: 12/30/2019 CLINICAL DATA:  Common bile duct dilatation. EXAM: ERCP TECHNIQUE: Multiple spot images obtained with the fluoroscopic device and submitted for interpretation post-procedure. FLUOROSCOPY TIME:  Fluoroscopy Time:  8 minutes, 22 seconds COMPARISON:  MRCP 12/28/2019 FINDINGS: Retrograde cholangiogram demonstrates a dilated extrahepatic biliary system. Round filling defect is compatible with a stone. Common bile duct was cannulated. Balloon sweep was performed. IMPRESSION: Dilated common bile duct with choledocholithiasis.  Stone removal. These images were submitted for radiologic interpretation only. Please see the procedural report for the amount of contrast and the fluoroscopy time utilized. Electronically Signed   By: Markus Daft M.D.   On: 12/30/2019 13:44   MR ABDOMEN MRCP W WO CONTAST  Result Date: 12/29/2019 CLINICAL DATA:   Abdominal pain. Elevated bilirubin. Evaluate for biliary obstruction EXAM: MRI ABDOMEN WITHOUT AND WITH CONTRAST (INCLUDING MRCP) TECHNIQUE: Multiplanar multisequence MR imaging of the abdomen was performed both before and after the administration of intravenous contrast. Heavily T2-weighted images of the biliary and pancreatic ducts were obtained, and three-dimensional MRCP images were rendered by post processing. CONTRAST:  40mL GADAVIST GADOBUTROL 1 MMOL/ML IV SOLN COMPARISON:  None. FINDINGS: Lower chest: Lung bases are clear. Hepatobiliary: No intrahepatic biliary duct dilatation. The common hepatic duct measures 10 mm. The common bile duct measures 12 mm. There is a filling defect within distal common bile duct measuring 3 mm on image 22/series 3. The distal common bile duct stone is also well seen on the MR P sequences (series 13) There multiple gallstones within the gallbladder. No gallbladder distension. Pancreas: No pancreatic duct dilatation. No significant peripancreatic inflammation. The distal common bile duct stone is well seen on coronal image 15/5) Spleen: Normal spleen Adrenals/urinary tract: Adrenal glands and kidneys  normal. Stomach/Bowel: Stomach, small bowel, appendix, and cecum are normal. The colon and rectosigmoid colon are normal. Vascular/Lymphatic: Abdominal aorta is normal caliber. No periportal or retroperitoneal adenopathy. No pelvic adenopathy. Other: No free fluid. Musculoskeletal: No aggressive osseous lesion. IMPRESSION: 1. Distal common bile duct stone (choledocholithiasis) with moderate extrahepatic duct dilatation. 2. Cholelithiasis without evidence of acute cholecystitis. 3. No pancreatic duct dilatation or peripancreatic inflammation. Findings conveyed to Dr. Kennon Holter by Dr. Derrel Nip on 11 19 2021 at 6:30 p.m. Electronically Signed   By: Suzy Bouchard M.D.   On: 12/29/2019 09:57     .Adin Hector, M.D., F.A.C.S. [facility] [address], [place], [place] [address] [phone] Main / Paging  12/31/2019 9:21 AM    Adin Hector

## 2019-12-31 NOTE — Discharge Summary (Signed)
Physician Discharge Summary   Patient ID: Katrina Calhoun MRN: [id number] DOB/AGE: 56-Aug-1967 56 y.o.  Admit date: 12/28/2019 Discharge date: 12/31/2019  Primary Care Physician:  Pcp, No   Recommendations for Outpatient Follow-up:  1. Started on Protonix 40 mg daily 2. Recommended to follow-up with GI and general surgery  Home Health: None, at baseline, ambulating in the room, tolerating solid diet Equipment/Devices:   Discharge Condition: stable CODE STATUS: FULL Diet recommendation: Heart healthy diet   Discharge Diagnoses:     Choledocholithiasis status post ERCP with sphincterotomy and common duct stone extraction Cholelithiasis with transaminitis Status post laparoscopic cholecystectomy Erosive esophagitis and esophageal stricture status post balloon dilatation  COPD with chronic bronchitis ([place]) Essential hypertension Hyponatremia, hypokalemia   Consults: General surgery Gastroenterology, Dr. Henrene Pastor    Allergies:  No Known Allergies   DISCHARGE MEDICATIONS: Allergies as of 12/31/2019   No Known Allergies     Medication List    TAKE these medications   albuterol 108 (90 Base) MCG/ACT inhaler Commonly known as: VENTOLIN HFA Inhale 2 puffs into the lungs every 6 (six) hours as needed for wheezing.   amLODipine 5 MG tablet Commonly known as: NORVASC Take 1 tablet (5 mg total) by mouth daily.   Fluticasone-Salmeterol 250-50 MCG/DOSE Aepb Commonly known as: ADVAIR Inhale 1 puff into the lungs every 12 (twelve) hours.   Ipratropium-Albuterol 20-100 MCG/ACT Aers respimat Commonly known as: COMBIVENT Inhale 2 puffs into the lungs every 4 (four) hours as needed for wheezing or shortness of breath.   ondansetron 4 MG disintegrating tablet Commonly known as: Zofran ODT Take 1 tablet (4 mg total) by mouth every 8 (eight) hours as needed for nausea or vomiting.   pantoprazole 40 MG tablet Commonly known as: PROTONIX Take 1 tablet (40 mg total) by mouth  daily before breakfast.   polyethylene glycol 17 g packet Commonly known as: MIRALAX / GLYCOLAX Take 17 g by mouth daily as needed for mild constipation, moderate constipation or severe constipation.   psyllium 95 % Pack Commonly known as: HYDROCIL/METAMUCIL Take 1 packet by mouth daily.   traMADol 50 MG tablet Commonly known as: ULTRAM Take 1-2 tablets (50-100 mg total) by mouth every 6 (six) hours as needed for moderate pain or severe pain.   Tudorza Pressair 400 MCG/ACT Aepb Generic drug: Aclidinium Bromide Inhale 400 mcg into the lungs every 12 (twelve) hours.            Discharge Care Instructions  (From admission, onward)         Start     Ordered   12/31/19 0000  Discharge wound care:       Comments: It is good for closed incisions and even open wounds to be washed every day.  Shower every day.  Short baths are fine.  Wash the incisions and wounds clean with soap & water.    You may leave closed incisions open to air if it is dry.   You may cover the incision with clean gauze & replace it after your daily shower for comfort.  TEGADERM:  You have clear gauze band-aid dressing over your closed incision.  Remove the dressing 3 days after surgery.   12/31/19 1130   12/31/19 0000  If the dressing is still on your incision site when you go home, remove it on the third day after your surgery date. Remove dressing if it begins to fall off, or if it is dirty or damaged before the third day.  12/31/19 1316           Brief H and P: For complete details please refer to admission H and P, but in briefPatient is a 54 year old female with history of COPD, hypertension presented with 1 week of progressive epigastric pain.  Over the last 1 week, she had intermittent colicky, severe epigastric pain with nausea. She initially went to ER at [facility] where AST/ALT> 300, total bilirubin 2.8 and CT abdomen showed dilated CBD.  Patient was transferred to [facility] for GI  evaluation and ERCP   Hospital Course:   Choledocholithiasis with elevated transaminitis -MRCP showed distal CBD stone with moderate extrahepatic ductal dilation, cholelithiasis without evidence of cholecystitis -Alk phos 1239 on admission, AST 158, ALT 161, total bili 1.0, lipase 73.  Alkaline phosphatase 807, AST 26, ALT 66 GI and general surgery were consulted -ERCP showed choledocholithiasis status post sphincterotomy in common duct stone extraction, erosive esophagitis and esophageal stricture status post balloon dilatation of the stricture to 18 mm.  Continue Protonix 40 mg daily -Patient underwent laparoscopic cholecystectomy today.  Seen postop, ambulating in the room without any difficulty, tolerating solid diet. Patient cleared by general surgery for discharge home   Erosive esophagitis, esophageal stricture seen on ERCP -Status post balloon dilation of the stricture to 18 mm -Started on Protonix 40 mg daily, outpatient follow-up with GI     COPD with chronic bronchitis ([place]) -Currently stable, no active wheezing  -Continue inhalers  -Per patient quit smoking 4 months ago  Hypokalemia -Resolved  Hyponatremia -Likely due to poor p.o. intake prior to admission, patient was placed on IV fluid hydration, resolved  Essential hypertension BP currently stable, continue Norvasc   Day of Discharge S: Doing well, seen postop, ambulating in the room, tolerating solid diet.  Wants to go home.  Patient's family member in the room.  BP (!) 131/91 (BP Location: Right Arm)    Pulse 91    Temp 97.6 F (36.4 C)    Resp 15    Ht $R'5\' 3"'Zh$  (1.6 m)    Wt 52.2 kg    SpO2 100%    BMI 20.37 kg/m   Physical Exam: General: Alert and awake oriented x3 not in any acute distress. HEENT: anicteric sclera, pupils reactive to light and accommodation CVS: S1-S2 clear no murmur rubs or gallops Chest: clear to auscultation bilaterally, no wheezing rales or rhonchi Abdomen: soft nontender,  nondistended, normal bowel sounds, incision CDI, dressing intact Extremities: no cyanosis, clubbing or edema noted bilaterally Neuro: Cranial nerves II-XII intact, no focal neurological deficits    Get Medicines reviewed and adjusted: Please take all your medications with you for your next visit with your Primary MD  Please request your Primary MD to go over all hospital tests and procedure/radiological results at the follow up. Please ask your Primary MD to get all Hospital records sent to his/her office.  If you experience worsening of your admission symptoms, develop shortness of breath, life threatening emergency, suicidal or homicidal thoughts you must seek medical attention immediately by calling 911 or calling your MD immediately  if symptoms less severe.  You must read complete instructions/literature along with all the possible adverse reactions/side effects for all the Medicines you take and that have been prescribed to you. Take any new Medicines after you have completely understood and accept all the possible adverse reactions/side effects.   Do not drive when taking pain medications.   Do not take more than prescribed Pain, Sleep and Anxiety Medications  Special Instructions: If you have smoked or chewed Tobacco  in the last 2 yrs please stop smoking, stop any regular Alcohol  and or any Recreational drug use.  Wear Seat belts while driving.  Please note  You were cared for by a hospitalist during your hospital stay. Once you are discharged, your primary care physician will handle any further medical issues. Please note that NO REFILLS for any discharge medications will be authorized once you are discharged, as it is imperative that you return to your primary care physician (or establish a relationship with a primary care physician if you do not have one) for your aftercare needs so that they can reassess your need for medications and monitor your lab values.   The results of  significant diagnostics from this hospitalization (including imaging, microbiology, ancillary and laboratory) are listed below for reference.      Procedures/Studies:  MR 3D Recon At Scanner  Result Date: 12/29/2019 CLINICAL DATA:  Abdominal pain. Elevated bilirubin. Evaluate for biliary obstruction EXAM: MRI ABDOMEN WITHOUT AND WITH CONTRAST (INCLUDING MRCP) TECHNIQUE: Multiplanar multisequence MR imaging of the abdomen was performed both before and after the administration of intravenous contrast. Heavily T2-weighted images of the biliary and pancreatic ducts were obtained, and three-dimensional MRCP images were rendered by post processing. CONTRAST:  20mL GADAVIST GADOBUTROL 1 MMOL/ML IV SOLN COMPARISON:  None. FINDINGS: Lower chest: Lung bases are clear. Hepatobiliary: No intrahepatic biliary duct dilatation. The common hepatic duct measures 10 mm. The common bile duct measures 12 mm. There is a filling defect within distal common bile duct measuring 3 mm on image 22/series 3. The distal common bile duct stone is also well seen on the MR P sequences (series 13) There multiple gallstones within the gallbladder. No gallbladder distension. Pancreas: No pancreatic duct dilatation. No significant peripancreatic inflammation. The distal common bile duct stone is well seen on coronal image 15/5) Spleen: Normal spleen Adrenals/urinary tract: Adrenal glands and kidneys normal. Stomach/Bowel: Stomach, small bowel, appendix, and cecum are normal. The colon and rectosigmoid colon are normal. Vascular/Lymphatic: Abdominal aorta is normal caliber. No periportal or retroperitoneal adenopathy. No pelvic adenopathy. Other: No free fluid. Musculoskeletal: No aggressive osseous lesion. IMPRESSION: 1. Distal common bile duct stone (choledocholithiasis) with moderate extrahepatic duct dilatation. 2. Cholelithiasis without evidence of acute cholecystitis. 3. No pancreatic duct dilatation or peripancreatic inflammation.  Findings conveyed to Dr. Kennon Holter by Dr. Derrel Nip on 11 19 2021 at 6:30 p.m. Electronically Signed   By: Suzy Bouchard M.D.   On: 12/29/2019 09:57   DG ERCP BILIARY & PANCREATIC DUCTS  Result Date: 12/30/2019 CLINICAL DATA:  Common bile duct dilatation. EXAM: ERCP TECHNIQUE: Multiple spot images obtained with the fluoroscopic device and submitted for interpretation post-procedure. FLUOROSCOPY TIME:  Fluoroscopy Time:  8 minutes, 22 seconds COMPARISON:  MRCP 12/28/2019 FINDINGS: Retrograde cholangiogram demonstrates a dilated extrahepatic biliary system. Round filling defect is compatible with a stone. Common bile duct was cannulated. Balloon sweep was performed. IMPRESSION: Dilated common bile duct with choledocholithiasis.  Stone removal. These images were submitted for radiologic interpretation only. Please see the procedural report for the amount of contrast and the fluoroscopy time utilized. Electronically Signed   By: Markus Daft M.D.   On: 12/30/2019 13:44   MR ABDOMEN MRCP W WO CONTAST  Result Date: 12/29/2019 CLINICAL DATA:  Abdominal pain. Elevated bilirubin. Evaluate for biliary obstruction EXAM: MRI ABDOMEN WITHOUT AND WITH CONTRAST (INCLUDING MRCP) TECHNIQUE: Multiplanar multisequence MR imaging of the abdomen was performed both before and  after the administration of intravenous contrast. Heavily T2-weighted images of the biliary and pancreatic ducts were obtained, and three-dimensional MRCP images were rendered by post processing. CONTRAST:  63mL GADAVIST GADOBUTROL 1 MMOL/ML IV SOLN COMPARISON:  None. FINDINGS: Lower chest: Lung bases are clear. Hepatobiliary: No intrahepatic biliary duct dilatation. The common hepatic duct measures 10 mm. The common bile duct measures 12 mm. There is a filling defect within distal common bile duct measuring 3 mm on image 22/series 3. The distal common bile duct stone is also well seen on the MR P sequences (series 13) There multiple gallstones within the  gallbladder. No gallbladder distension. Pancreas: No pancreatic duct dilatation. No significant peripancreatic inflammation. The distal common bile duct stone is well seen on coronal image 15/5) Spleen: Normal spleen Adrenals/urinary tract: Adrenal glands and kidneys normal. Stomach/Bowel: Stomach, small bowel, appendix, and cecum are normal. The colon and rectosigmoid colon are normal. Vascular/Lymphatic: Abdominal aorta is normal caliber. No periportal or retroperitoneal adenopathy. No pelvic adenopathy. Other: No free fluid. Musculoskeletal: No aggressive osseous lesion. IMPRESSION: 1. Distal common bile duct stone (choledocholithiasis) with moderate extrahepatic duct dilatation. 2. Cholelithiasis without evidence of acute cholecystitis. 3. No pancreatic duct dilatation or peripancreatic inflammation. Findings conveyed to Dr. Kennon Holter by Dr. Derrel Nip on 11 19 2021 at 6:30 p.m. Electronically Signed   By: Suzy Bouchard M.D.   On: 12/29/2019 09:57       LAB RESULTS: Basic Metabolic Panel: Recent Labs  Lab 12/28/19 1033 12/29/19 0329 12/30/19 0303 12/31/19 0328  NA 134*   < > 135 136  K 3.1*   < > 3.2* 3.6  CL 102   < > 103 105  CO2 25   < > 24 23  GLUCOSE 99   < > 115* 137*  BUN 6   < > <5* 5*  CREATININE 0.41*   < > 0.41* 0.50  CALCIUM 8.6*   < > 8.4* 8.6*  MG 1.8  --   --   --    < > = values in this interval not displayed.   Liver Function Tests: Recent Labs  Lab 12/30/19 0303 12/31/19 0328  AST 57* 26  ALT 87* 66*  ALKPHOS 992* 807*  BILITOT 0.8 0.6  PROT 5.6* 6.0*  ALBUMIN 2.4* 2.6*   Recent Labs  Lab 12/28/19 1033 12/29/19 0329  LIPASE 73* 54*   No results for input(s): AMMONIA in the last 168 hours. CBC: Recent Labs  Lab 12/28/19 1033 12/28/19 1033 12/30/19 0303  WBC 9.8  --  8.8  NEUTROABS 7.8*  --   --   HGB 9.6*  --  9.3*  HCT 29.2*  --  28.2*  MCV 100.0   < > 99.3  PLT 372  --  357   < > = values in this interval not displayed.   Cardiac  Enzymes: No results for input(s): CKTOTAL, CKMB, CKMBINDEX, TROPONINI in the last 168 hours. BNP: Invalid input(s): POCBNP CBG: No results for input(s): GLUCAP in the last 168 hours.     Disposition and Follow-up: Discharge Instructions    Call MD for:   Complete by: As directed    FEVER > 101.5 F  (temperatures < 101.5 F are not significant)   Call MD for:  extreme fatigue   Complete by: As directed    Call MD for:  persistant dizziness or light-headedness   Complete by: As directed    Call MD for:  persistant nausea and vomiting   Complete by:  As directed    Call MD for:  redness, tenderness, or signs of infection (pain, swelling, redness, odor or green/yellow discharge around incision site)   Complete by: As directed    Call MD for:  severe uncontrolled pain   Complete by: As directed    Diet - low sodium heart healthy   Complete by: As directed    Start with a bland diet such as soups, liquids, starchy foods, low fat foods, etc. the first few days at home. Gradually advance to a solid, low-fat, high fiber diet by the end of the first week at home.   Add a fiber supplement to your diet (Metamucil, etc) If you feel full, bloated, or constipated, stay on a full liquid or pureed/blenderized diet for a few days until you feel better and are no longer constipated.   Diet - low sodium heart healthy   Complete by: As directed    Discharge instructions   Complete by: As directed    See Discharge Instructions If you are not getting better after two weeks or are noticing you are getting worse, contact our office [phone] for further advice.  We may need to adjust your medications, re-evaluate you in the office, send you to the emergency room, or see what other things we can do to help. The clinic staff is available to answer your questions during regular business hours (8:30am-5pm).  Please don't hesitate to call and ask to speak to one of our nurses for clinical concerns.    A  surgeon from [facility] is always on call at the hospitals 24 hours/day If you have a medical emergency, go to the nearest emergency room or call 911.   Discharge wound care:   Complete by: As directed    It is good for closed incisions and even open wounds to be washed every day.  Shower every day.  Short baths are fine.  Wash the incisions and wounds clean with soap & water.    You may leave closed incisions open to air if it is dry.   You may cover the incision with clean gauze & replace it after your daily shower for comfort.  TEGADERM:  You have clear gauze band-aid dressing over your closed incision.  Remove the dressing 3 days after surgery.   Driving Restrictions   Complete by: As directed    You may drive when: - you are no longer taking narcotic prescription pain medication - you can comfortably wear a seatbelt - you can safely make sudden turns/stops without pain.   If the dressing is still on your incision site when you go home, remove it on the third day after your surgery date. Remove dressing if it begins to fall off, or if it is dirty or damaged before the third day.   Complete by: As directed    Increase activity slowly   Complete by: As directed    Start light daily activities --- self-care, walking, climbing stairs- beginning the day after surgery.  Gradually increase activities as tolerated.  Control your pain to be active.  Stop when you are tired.  Ideally, walk several times a day, eventually an hour a day.   Most people are back to most day-to-day activities in a few weeks.  It takes 4-6 weeks to get back to unrestricted, intense activity. If you can walk 30 minutes without difficulty, it is safe to try more intense activity such as jogging, treadmill, bicycling, low-impact aerobics, swimming,  etc. Save the most intensive and strenuous activity for last (Usually 4-8 weeks after surgery) such as sit-ups, heavy lifting, contact sports, etc.  Refrain from any  intense heavy lifting or straining until you are off narcotics for pain control.  You will have off days, but things should improve week-by-week. DO NOT PUSH THROUGH PAIN.  Let pain be your guide: If it hurts to do something, don't do it.   Increase activity slowly   Complete by: As directed    Lifting restrictions   Complete by: As directed    If you can walk 30 minutes without difficulty, it is safe to try more intense activity such as jogging, treadmill, bicycling, low-impact aerobics, swimming, etc. Save the most intensive and strenuous activity for last (Usually 4-8 weeks after surgery) such as sit-ups, heavy lifting, contact sports, etc.   Refrain from any intense heavy lifting or straining until you are off narcotics for pain control.  You will have off days, but things should improve week-by-week. DO NOT PUSH THROUGH PAIN.  Let pain be your guide: If it hurts to do something, don't do it.  Pain is your body warning you to avoid that activity for another week until the pain goes down.   May shower / Bathe   Complete by: As directed    May walk up steps   Complete by: As directed    Remove dressing in 72 hours   Complete by: As directed    Make sure all dressings are removed by the third day after surgery.  Leave incisions open to air.  OK to cover incisions with gauze or bandages as desired   Sexual Activity Restrictions   Complete by: As directed    You may have sexual intercourse when it is comfortable. If it hurts to do something, stop.       DISPOSITION: Home   DISCHARGE FOLLOW-UP  Follow-up [facility], [place]. Go on 01/22/2020.   Specialty: General Surgery Why: Your appointment is 12/14 at 8:45 am Please arrive 30 minutes prior to your appointment to check in and fill out paperwork. Bring photo ID and insurance information. Contact information: [address] [place] [phone]                Time coordinating discharge:  40 minutes  Signed:   Estill Cotta M.D. [facility] 12/31/2019, 1:32 PM

## 2019-12-31 NOTE — Progress Notes (Signed)
[facility]                                                                              Patient Demographics  Katrina Calhoun, is a 54 y.o. female, DOB - Aug 02, 1965, VFI:[id number]  Admit date - 12/28/2019   Admitting Physician Edwin Dada, MD  Outpatient Primary MD for the patient is Pcp, No  Outpatient specialists:   LOS - 2  days   Medical records reviewed and are as summarized below:    No chief complaint on file.      Brief summary   Patient is a 54 year old female with history of COPD, hypertension presented with 1 week of progressive epigastric pain.  Over the last 1 week, she had intermittent colicky, severe epigastric pain with nausea. She initially went to ER at [facility] where AST/ALT> 300, total bilirubin 2.8 and CT abdomen showed dilated CBD.  Patient was transferred to [facility] for GI evaluation and ERCP  Assessment & Plan    Principal Problem:   Choledocholithiasis with elevated transaminitis -MRCP showed distal CBD stone with moderate extrahepatic ductal dilation, cholelithiasis without evidence of cholecystitis -Alk phos 1239 on admission, AST 158, ALT 161, total bili 1.0, lipase 73 -/21 showed choledocholithiasis status post sphincterotomy in common duct stone extraction, erosive esophagitis and esophageal stricture status post balloon dilatation of the stricture to 18 mm.  Continue Protonix 40 mg daily -  -Plan for laparoscopic cholecystectomy today   Active Problems:   COPD with chronic bronchitis (HCC) -Currently stable, no active wheezing  -Continue inhalers  -Per patient quit smoking 4 months ago  Hypokalemia -Resolved  Hyponatremia -Likely due to poor p.o. intake prior to admission, patient was placed on IV fluid hydration, resolved  Essential hypertension BP currently stable, continue Norvasc  Code Status: Full CODE STATUS DVT Prophylaxis: SCDs Family Communication: Discussed all imaging results, lab  results, explained to the patient's significant other at the bedside on 11/21 Patient wants to go home after surgery today.  Will follow recommendations from surgery   Disposition Plan:     Status is: Observation  The patient will require  Inpatient level of care appropriate due to severity of illness  Dispo: The patient is from: Home              Anticipated d/c is to: Home              Anticipated d/c date is: 2 days              Patient currently is not medically stable to d/c.  Plan for lap chole today     Time Spent in minutes 25 minutes  Procedures:  MRCP ERCP Laparoscopic cholecystectomy today  Consultants:   General surgery Gastroenterology  Antimicrobials:   Anti-infectives (From admission, onward)   Start     Dose/Rate Route Frequency Ordered Stop   12/29/19 1500  [MAR Hold]  piperacillin-tazobactam (ZOSYN) IVPB 3.375 g        (MAR Hold since Mon 12/31/2019 at 0808.Hold Reason: Transfer to a Procedural area.)   3.375 g 12.5 mL/hr over 240 Minutes Intravenous Every 8 hours 12/29/19 1354  Medications  Scheduled Meds: . [MAR Hold] amLODipine  5 mg Oral Daily  . bupivacaine liposome  20 mL Infiltration Once  . fentaNYL      . [MAR Hold] mometasone-formoterol  2 puff Inhalation BID  . [MAR Hold] pantoprazole  40 mg Oral Q0600  . [MAR Hold] umeclidinium bromide  1 puff Inhalation Daily   Continuous Infusions: . sodium chloride 100 mL/hr at 12/31/19 0200  . [MAR Hold] piperacillin-tazobactam (ZOSYN)  IV 3.375 g (12/31/19 0621)   PRN Meds:.[MAR Hold] albuterol, bupivacaine liposome, bupivacaine-EPINEPHrine, fentaNYL (SUBLIMAZE) injection, [MAR Hold]  HYDROmorphone (DILAUDID) injection, lactated ringers, [MAR Hold] ondansetron (ZOFRAN) IV, ondansetron (ZOFRAN) IV, sodium chloride irrigation      Subjective:   Katrina Calhoun was seen and examined today.  Seen prior to the surgery, pain in the right upper quadrant, awaiting cholecystectomy.  Patient  is hoping to go home after the surgery today.  Afebrile, no chest pain, shortness of breath, nausea vomiting.   Objective:   Vitals:   12/31/19 1145 12/31/19 1200 12/31/19 1215 12/31/19 1230  BP: 139/89 (!) 148/93 (!) 135/92 125/86  Pulse: 90 85 85 73  Resp: $Remo'16 14 13 10  'OxBgG$ Temp:      TempSrc:      SpO2: 100% 100% 100% 100%  Weight:      Height:        Intake/Output Summary (Last 24 hours) at 12/31/2019 1245 Last data filed at 12/31/2019 1109 Gross per 24 hour  Intake 3812.84 ml  Output 20 ml  Net 3792.84 ml     Wt Readings from Last 3 Encounters:  12/28/19 52.2 kg   Physical Exam  General: Alert and oriented x 3, NAD  Cardiovascular: S1 S2 clear, RRR. No pedal edema b/l  Respiratory: CTAB, no wheezing, rales or rhonchi  Gastrointestinal: Soft, RUQ TTP nondistended, NBS  Ext: no pedal edema bilaterally  Neuro: no new deficits  Musculoskeletal: No cyanosis, clubbing  Skin: No rashes  Psych: Normal affect and demeanor, alert and oriented x3    Data Reviewed:  I have personally reviewed following labs and imaging studies  Micro Results Recent Results (from the past 240 hour(s))  Resp Panel by RT-PCR (Flu A&B, Covid) Nasopharyngeal Swab     Status: None   Collection Time: 12/29/19  3:55 PM   Specimen: Nasopharyngeal Swab; Nasopharyngeal(NP) swabs in vial transport medium  Result Value Ref Range Status   SARS Coronavirus 2 by RT PCR NEGATIVE NEGATIVE Final    Comment: (NOTE) SARS-CoV-2 target nucleic acids are NOT DETECTED.  The SARS-CoV-2 RNA is generally detectable in upper respiratory specimens during the acute phase of infection. The lowest concentration of SARS-CoV-2 viral copies this assay can detect is 138 copies/mL. A negative result does not preclude SARS-Cov-2 infection and should not be used as the sole basis for treatment or other patient management decisions. A negative result may occur with  improper specimen collection/handling, submission of  specimen other than nasopharyngeal swab, presence of viral mutation(s) within the areas targeted by this assay, and inadequate number of viral copies(<138 copies/mL). A negative result must be combined with clinical observations, patient history, and epidemiological information. The expected result is Negative.  Fact Sheet for Patients:  EntrepreneurPulse.com.au  Fact Sheet for Healthcare Providers:  IncredibleEmployment.be  This test is no t yet approved or cleared by the [place] FDA and  has been authorized for detection and/or diagnosis of SARS-CoV-2 by FDA under an Emergency Use Authorization (EUA). This EUA will remain  in  effect (meaning this test can be used) for the duration of the COVID-19 declaration under Section 564(b)(1) of the Act, 21 U.S.C.section 360bbb-3(b)(1), unless the authorization is terminated  or revoked sooner.       Influenza A by PCR NEGATIVE NEGATIVE Final   Influenza B by PCR NEGATIVE NEGATIVE Final    Comment: (NOTE) The Xpert Xpress SARS-CoV-2/FLU/RSV plus assay is intended as an aid in the diagnosis of influenza from Nasopharyngeal swab specimens and should not be used as a sole basis for treatment. Nasal washings and aspirates are unacceptable for Xpert Xpress SARS-CoV-2/FLU/RSV testing.  Fact Sheet for Patients: EntrepreneurPulse.com.au  Fact Sheet for Healthcare Providers: IncredibleEmployment.be  This test is not yet approved or cleared by the [place] FDA and has been authorized for detection and/or diagnosis of SARS-CoV-2 by FDA under an Emergency Use Authorization (EUA). This EUA will remain in effect (meaning this test can be used) for the duration of the COVID-19 declaration under Section 564(b)(1) of the Act, 21 U.S.C. section 360bbb-3(b)(1), unless the authorization is terminated or revoked.  Performed at [facility], [place]  [address], [place], [place] [address]   MRSA PCR Screening     Status: None   Collection Time: 12/31/19  6:13 AM   Specimen: Nasopharyngeal  Result Value Ref Range Status   MRSA by PCR NEGATIVE NEGATIVE Final    Comment:        The GeneXpert MRSA Assay (FDA approved for NASAL specimens only), is one component of a comprehensive MRSA colonization surveillance program. It is not intended to diagnose MRSA infection nor to guide or monitor treatment for MRSA infections. Performed at [facility], [place] [address] [place], [place] [address]     Radiology Reports MR 3D Recon At Scanner  Result Date: 12/29/2019 CLINICAL DATA:  Abdominal pain. Elevated bilirubin. Evaluate for biliary obstruction EXAM: MRI ABDOMEN WITHOUT AND WITH CONTRAST (INCLUDING MRCP) TECHNIQUE: Multiplanar multisequence MR imaging of the abdomen was performed both before and after the administration of intravenous contrast. Heavily T2-weighted images of the biliary and pancreatic ducts were obtained, and three-dimensional MRCP images were rendered by post processing. CONTRAST:  19mL GADAVIST GADOBUTROL 1 MMOL/ML IV SOLN COMPARISON:  None. FINDINGS: Lower chest: Lung bases are clear. Hepatobiliary: No intrahepatic biliary duct dilatation. The common hepatic duct measures 10 mm. The common bile duct measures 12 mm. There is a filling defect within distal common bile duct measuring 3 mm on image 22/series 3. The distal common bile duct stone is also well seen on the MR P sequences (series 13) There multiple gallstones within the gallbladder. No gallbladder distension. Pancreas: No pancreatic duct dilatation. No significant peripancreatic inflammation. The distal common bile duct stone is well seen on coronal image 15/5) Spleen: Normal spleen Adrenals/urinary tract: Adrenal glands and kidneys normal. Stomach/Bowel: Stomach, small bowel, appendix, and cecum are normal. The colon and rectosigmoid colon are normal.  Vascular/Lymphatic: Abdominal aorta is normal caliber. No periportal or retroperitoneal adenopathy. No pelvic adenopathy. Other: No free fluid. Musculoskeletal: No aggressive osseous lesion. IMPRESSION: 1. Distal common bile duct stone (choledocholithiasis) with moderate extrahepatic duct dilatation. 2. Cholelithiasis without evidence of acute cholecystitis. 3. No pancreatic duct dilatation or peripancreatic inflammation. Findings conveyed to Dr. Kennon Holter by Dr. Derrel Nip on 11 19 2021 at 6:30 p.m. Electronically Signed   By: Suzy Bouchard M.D.   On: 12/29/2019 09:57   DG ERCP BILIARY & PANCREATIC DUCTS  Result Date: 12/30/2019 CLINICAL DATA:  Common bile duct dilatation. EXAM:  ERCP TECHNIQUE: Multiple spot images obtained with the fluoroscopic device and submitted for interpretation post-procedure. FLUOROSCOPY TIME:  Fluoroscopy Time:  8 minutes, 22 seconds COMPARISON:  MRCP 12/28/2019 FINDINGS: Retrograde cholangiogram demonstrates a dilated extrahepatic biliary system. Round filling defect is compatible with a stone. Common bile duct was cannulated. Balloon sweep was performed. IMPRESSION: Dilated common bile duct with choledocholithiasis.  Stone removal. These images were submitted for radiologic interpretation only. Please see the procedural report for the amount of contrast and the fluoroscopy time utilized. Electronically Signed   By: Markus Daft M.D.   On: 12/30/2019 13:44   MR ABDOMEN MRCP W WO CONTAST  Result Date: 12/29/2019 CLINICAL DATA:  Abdominal pain. Elevated bilirubin. Evaluate for biliary obstruction EXAM: MRI ABDOMEN WITHOUT AND WITH CONTRAST (INCLUDING MRCP) TECHNIQUE: Multiplanar multisequence MR imaging of the abdomen was performed both before and after the administration of intravenous contrast. Heavily T2-weighted images of the biliary and pancreatic ducts were obtained, and three-dimensional MRCP images were rendered by post processing. CONTRAST:  23mL GADAVIST GADOBUTROL 1 MMOL/ML IV  SOLN COMPARISON:  None. FINDINGS: Lower chest: Lung bases are clear. Hepatobiliary: No intrahepatic biliary duct dilatation. The common hepatic duct measures 10 mm. The common bile duct measures 12 mm. There is a filling defect within distal common bile duct measuring 3 mm on image 22/series 3. The distal common bile duct stone is also well seen on the MR P sequences (series 13) There multiple gallstones within the gallbladder. No gallbladder distension. Pancreas: No pancreatic duct dilatation. No significant peripancreatic inflammation. The distal common bile duct stone is well seen on coronal image 15/5) Spleen: Normal spleen Adrenals/urinary tract: Adrenal glands and kidneys normal. Stomach/Bowel: Stomach, small bowel, appendix, and cecum are normal. The colon and rectosigmoid colon are normal. Vascular/Lymphatic: Abdominal aorta is normal caliber. No periportal or retroperitoneal adenopathy. No pelvic adenopathy. Other: No free fluid. Musculoskeletal: No aggressive osseous lesion. IMPRESSION: 1. Distal common bile duct stone (choledocholithiasis) with moderate extrahepatic duct dilatation. 2. Cholelithiasis without evidence of acute cholecystitis. 3. No pancreatic duct dilatation or peripancreatic inflammation. Findings conveyed to Dr. Kennon Holter by Dr. Derrel Nip on 11 19 2021 at 6:30 p.m. Electronically Signed   By: Suzy Bouchard M.D.   On: 12/29/2019 09:57    Lab Data:  CBC: Recent Labs  Lab 12/28/19 1033 12/30/19 0303  WBC 9.8 8.8  NEUTROABS 7.8*  --   HGB 9.6* 9.3*  HCT 29.2* 28.2*  MCV 100.0 99.3  PLT 372 694   Basic Metabolic Panel: Recent Labs  Lab 12/28/19 1033 12/29/19 0329 12/30/19 0303 12/31/19 0328  NA 134* 133* 135 136  K 3.1* 3.0* 3.2* 3.6  CL 102 100 103 105  CO2 $Re'25 22 24 23  'LLT$ GLUCOSE 99 82 115* 137*  BUN 6 6 <5* 5*  CREATININE 0.41* 0.40* 0.41* 0.50  CALCIUM 8.6* 8.4* 8.4* 8.6*  MG 1.8  --   --   --    GFR: Estimated Creatinine Clearance: 64.7 mL/min (by C-G formula  based on SCr of 0.5 mg/dL). Liver Function Tests: Recent Labs  Lab 12/28/19 1033 12/29/19 0329 12/30/19 0303 12/31/19 0328  AST 158* 59* 57* 26  ALT 161* 107* 87* 66*  ALKPHOS 1,239* 953* 992* 807*  BILITOT 1.0 1.1 0.8 0.6  PROT 5.8* 5.4* 5.6* 6.0*  ALBUMIN 2.6* 2.5* 2.4* 2.6*   Recent Labs  Lab 12/28/19 1033 12/29/19 0329  LIPASE 73* 54*   No results for input(s): AMMONIA in the last 168 hours. Coagulation Profile: No  results for input(s): INR, PROTIME in the last 168 hours. Cardiac Enzymes: No results for input(s): CKTOTAL, CKMB, CKMBINDEX, TROPONINI in the last 168 hours. BNP (last 3 results) No results for input(s): PROBNP in the last 8760 hours. HbA1C: No results for input(s): HGBA1C in the last 72 hours. CBG: No results for input(s): GLUCAP in the last 168 hours. Lipid Profile: No results for input(s): CHOL, HDL, LDLCALC, TRIG, CHOLHDL, LDLDIRECT in the last 72 hours. Thyroid Function Tests: No results for input(s): TSH, T4TOTAL, FREET4, T3FREE, THYROIDAB in the last 72 hours. Anemia Panel: No results for input(s): VITAMINB12, FOLATE, FERRITIN, TIBC, IRON, RETICCTPCT in the last 72 hours. Urine analysis: No results found for: COLORURINE, APPEARANCEUR, LABSPEC, PHURINE, GLUCOSEU, HGBUR, BILIRUBINUR, KETONESUR, PROTEINUR, UROBILINOGEN, NITRITE, LEUKOCYTESUR   Danamarie Minami M.D. [facility] 12/31/2019, 12:45 PM   Call night coverage person covering after 7pm

## 2019-12-31 NOTE — Anesthesia Preprocedure Evaluation (Signed)
Anesthesia Evaluation  Patient identified by MRN, date of birth, ID band Patient awake    Reviewed: Allergy & Precautions, NPO status , Patient's Chart, lab work & pertinent test results  History of Anesthesia Complications Negative for: history of anesthetic complications  Airway Mallampati: II  TM Distance: >3 FB Neck ROM: Full    Dental  (+) Poor Dentition, Chipped, Missing,    Pulmonary COPD,  COPD inhaler, former smoker,  Covid-19 Nucleic Acid Test Results Lab Results      Component                Value               Date                      SARSCOV2NAA              NEGATIVE            12/29/2019              Pulmonary exam normal breath sounds clear to auscultation       Cardiovascular hypertension, Pt. on medications (-) angina(-) Past MI and (-) CHF Normal cardiovascular exam(-) dysrhythmias  Rhythm:Regular Rate:Normal     Neuro/Psych negative neurological ROS  negative psych ROS   GI/Hepatic Lab Results      Component                Value               Date                      ALT                      87 (H)              12/30/2019                AST                      57 (H)              12/30/2019                ALKPHOS                  992 (H)             12/30/2019                BILITOT                  0.8                 12/30/2019             Bile duct dilation and elevated LFTs Cholelithiasis with choledocholithiasis (S/P ERCP yesterday) acute cholecystitis    Endo/Other  negative endocrine ROS  Renal/GU negative Renal ROSLab Results      Component                Value               Date                      CREATININE               0.41 (L)  12/30/2019           Lab Results      Component                Value               Date                      K                        3.2 (L)             12/30/2019             negative genitourinary   Musculoskeletal negative  musculoskeletal ROS (+)   Abdominal   Peds  Hematology  (+) anemia , Lab Results      Component                Value               Date                      WBC                      8.8                 12/30/2019                HGB                      9.3 (L)             12/30/2019                HCT                      28.2 (L)            12/30/2019                MCV                      99.3                12/30/2019                PLT                      357                 12/30/2019            No blood thinners     Anesthesia Other Findings   Reproductive/Obstetrics                             Anesthesia Physical  Anesthesia Plan  ASA: II  Anesthesia Plan: General   Post-op Pain Management:    Induction: Intravenous, Rapid sequence and Cricoid pressure planned  PONV Risk Score and Plan: 3 and Ondansetron, Dexamethasone, Midazolam and Treatment may vary due to age or medical condition  Airway Management Planned: Oral ETT  Additional Equipment: None  Intra-op Plan:   Post-operative Plan: Extubation in OR  Informed Consent: I have reviewed the patients History and Physical, chart, labs and discussed the procedure including the risks, benefits and alternatives  for the proposed anesthesia with the patient or authorized representative who has indicated his/her understanding and acceptance.     Dental advisory given  Plan Discussed with: CRNA and Surgeon  Anesthesia Plan Comments:         Anesthesia Quick Evaluation

## 2019-12-31 NOTE — Plan of Care (Signed)
  Problem: Education: Goal: Knowledge of General Education information will improve Description Including pain rating scale, medication(s)/side effects and non-pharmacologic comfort measures Outcome: Progressing   Problem: Health Behavior/Discharge Planning: Goal: Ability to manage health-related needs will improve Outcome: Progressing   

## 2020-01-01 ENCOUNTER — Encounter (HOSPITAL_COMMUNITY): Payer: Self-pay | Admitting: Surgery

## 2020-01-01 LAB — SURGICAL PATHOLOGY

## 2021-05-05 IMAGING — MR MR ABDOMEN WO/W CM MRCP
33 of 37 series · 44 of 48 positions shown · IV contrast (gadavist)
Comparison: None.

CLINICAL DATA: Abdominal pain. Elevated bilirubin. Evaluate for
biliary obstruction



[Series 3: T2 fat-sat · axial · 6.0mm · 1.17mm/px · 1 of 38 slices shown]
[im 1/38]
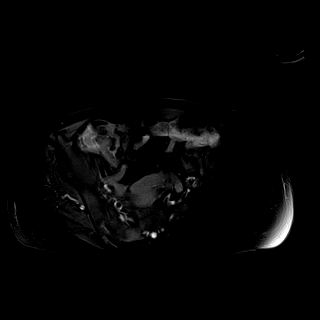

[Series 5: T2 · coronal · 6.0mm · 1.48mm/px · 1 of 30 slices shown (1 of 4)]
[im 1/30]
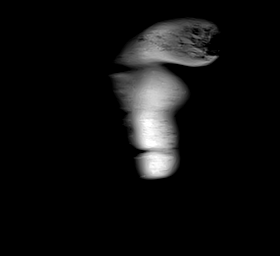

[Series 5: T2 · coronal · 6.0mm · 1.48mm/px · 1 of 30 slices shown (2 of 4)]
[im 1/30]
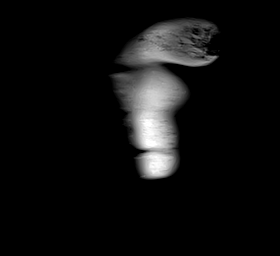

[Series 6: DWI · axial · 6.0mm · 1.46mm/px · 1 of 80 slices shown (1 of 4)]
[im 1/80]
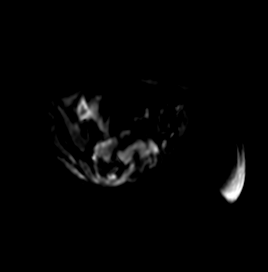

[Series 6: DWI · axial · 6.0mm · 1.46mm/px · 1 of 80 slices shown (2 of 4)]
[im 1/80]
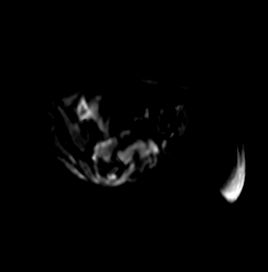

[Series 7: DWI · axial · 6.0mm · 1.46mm/px · 1 of 40 slices shown (3 of 4)]
[im 1/40]
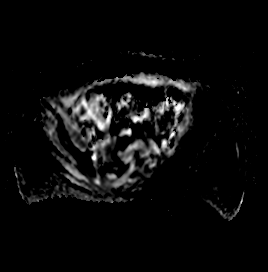

[Series 7: DWI · axial · 6.0mm · 1.46mm/px · 1 of 40 slices shown (4 of 4)]
[im 1/40]
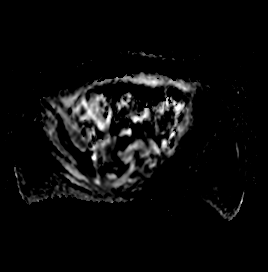

[Series 8: T1 · axial · 3.6mm · 1.17mm/px · 1 of 80 slices shown (1 of 4)]
[im 1/80]
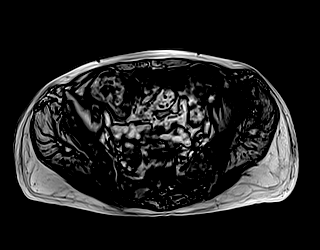

[Series 8: T1 · axial · 3.6mm · 1.17mm/px · 1 of 80 slices shown (2 of 4)]
[im 1/80]
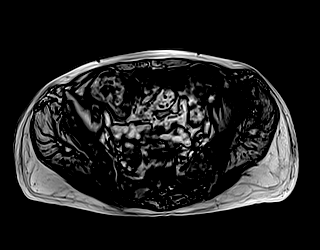

[Series 9: T1 · axial · 3.6mm · 1.17mm/px · 1 of 80 slices shown (3 of 4)]
[im 1/80]
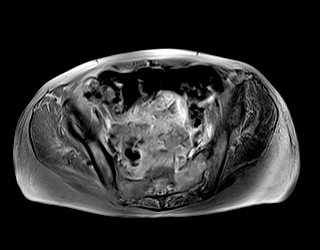

[Series 9: T1 · axial · 3.6mm · 1.17mm/px · 1 of 80 slices shown (4 of 4)]
[im 1/80]
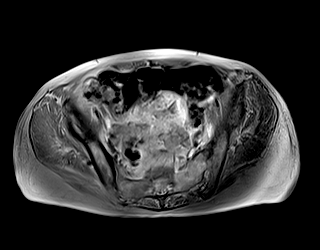

[Series 11: cor obl thk · sagittal · 50.0mm · 0.78mm/px · 1 of 9 slices shown (1 of 2)]
[im 1/9]
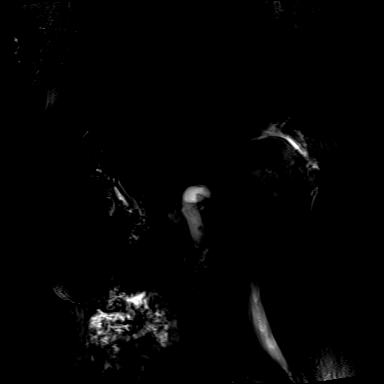

[Series 11: cor obl thk · sagittal · 50.0mm · 0.78mm/px · 1 of 9 slices shown (2 of 2)]
[im 1/9]
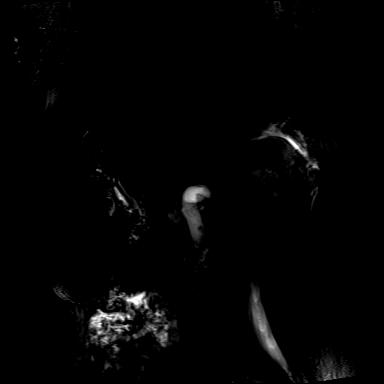

[Series 13: cor_3d_spc_trig · coronal · 1.1mm · 0.49mm/px · 1 of 72 slices shown (1 of 2)]
[im 1/72]
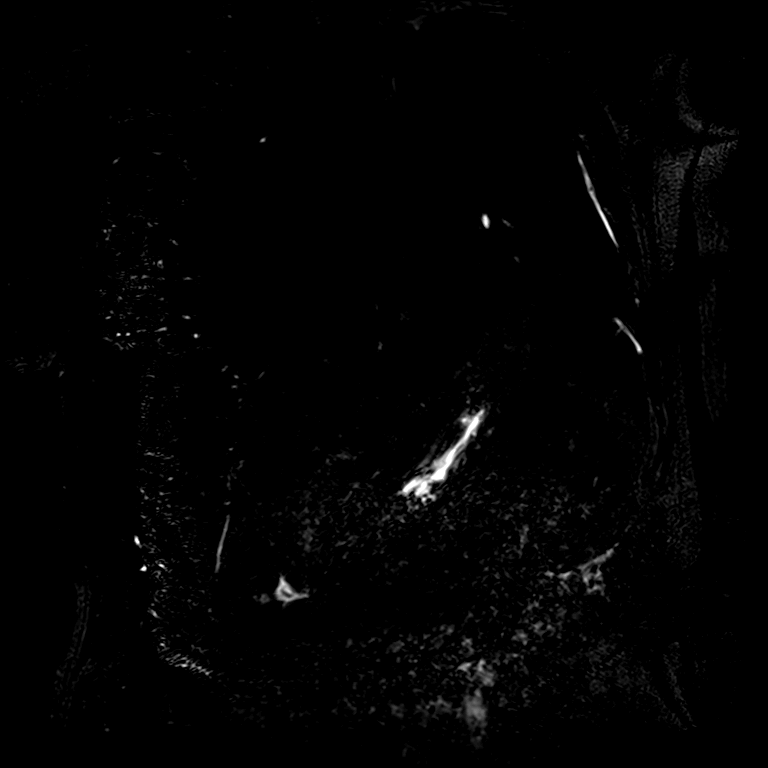

[Series 13: cor_3d_spc_trig · coronal · 1.1mm · 0.49mm/px · 1 of 72 slices shown (2 of 2)]
[im 1/72]
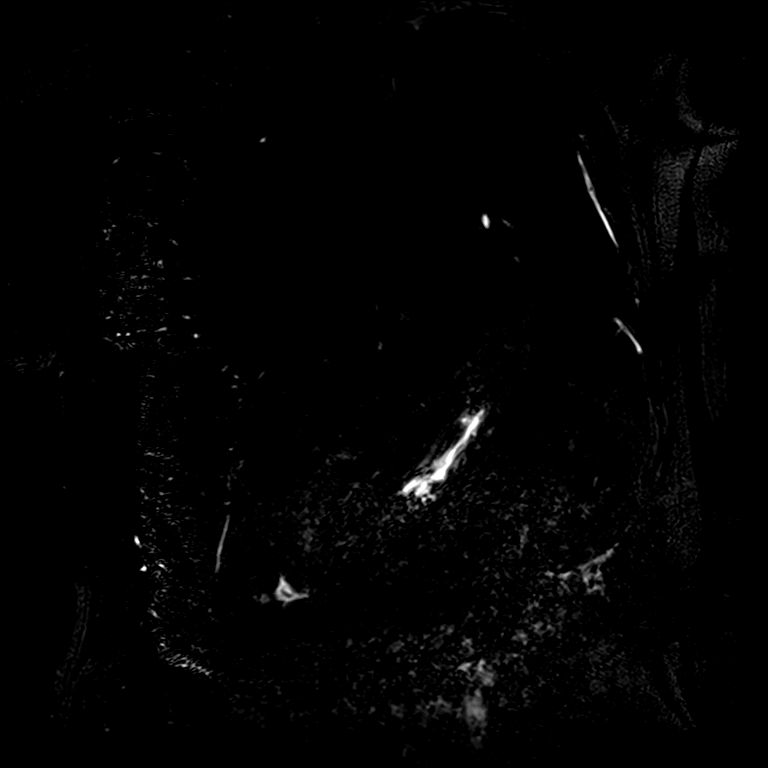

[Series 15: T2 · axial · 6.0mm · 1.56mm/px · 1 of 36 slices shown (3 of 4)]
[im 1/36]
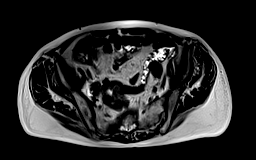

[Series 15: T2 · axial · 6.0mm · 1.56mm/px · 1 of 36 slices shown (4 of 4)]
[im 1/36]
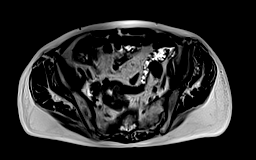

[Series 17: T1 dynamic · axial · 3.0mm · 1.16mm/px · 1 of 80 slices shown (1 of 12)]
[im 1/80]
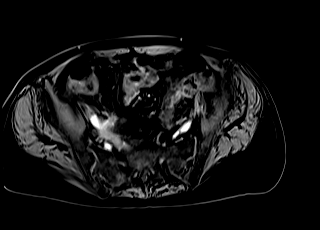

[Series 17: T1 dynamic · axial · 3.0mm · 1.16mm/px · 1 of 80 slices shown (2 of 12)]
[im 1/80]
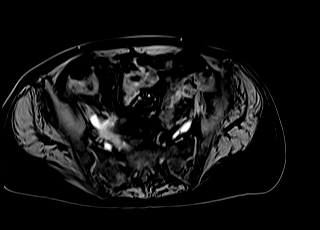

[Series 20: T1 dynamic · axial · 3.0mm · 1.16mm/px · z∈[-188,+49]mm · 2 of 80 slices shown (3 of 12)]
[im 1/80]
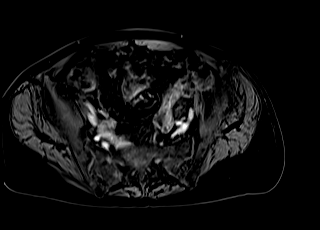
[im 80/80]
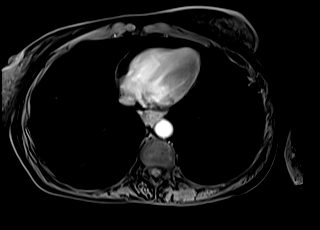

[Series 20: T1 dynamic · axial · 3.0mm · 1.16mm/px · z∈[-188,+49]mm · 2 of 80 slices shown (4 of 12)]
[im 1/80]
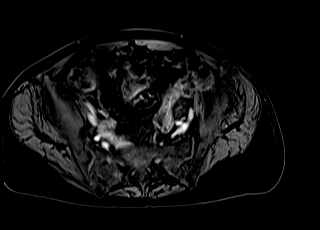
[im 80/80]
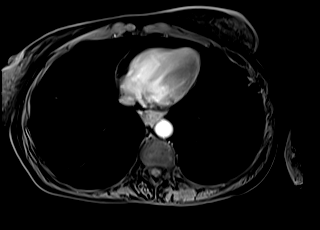

[Series 22: T1 dynamic · axial · 3.0mm · 1.16mm/px · z∈[-188,+49]mm · 2 of 80 slices shown (5 of 12)]
[im 1/80]
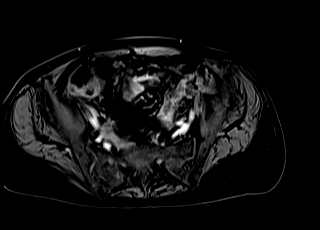
[im 80/80]
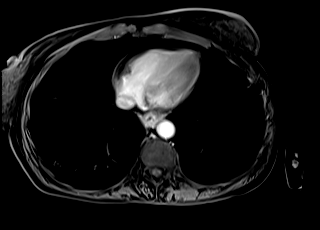

[Series 22: T1 dynamic · axial · 3.0mm · 1.16mm/px · z∈[-188,+49]mm · 2 of 80 slices shown (6 of 12)]
[im 1/80]
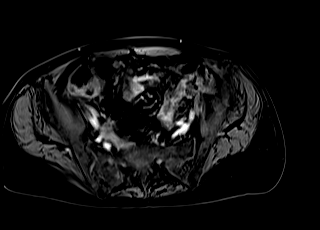
[im 80/80]
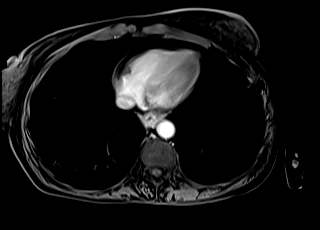

[Series 24: T1 dynamic · axial · 3.0mm · 1.16mm/px · z∈[-188,+49]mm · 2 of 80 slices shown (7 of 12)]
[im 1/80]
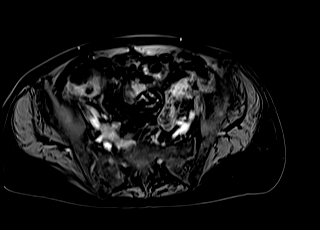
[im 80/80]
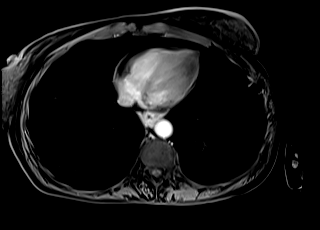

[Series 24: T1 dynamic · axial · 3.0mm · 1.16mm/px · z∈[-188,+49]mm · 2 of 80 slices shown (8 of 12)]
[im 1/80]
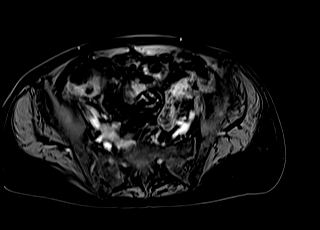
[im 80/80]
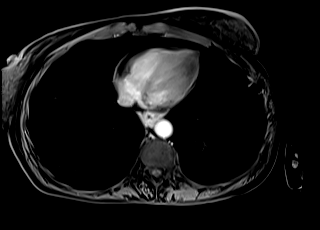

[Series 27: T1 dynamic · coronal · 5.0mm · 1.25mm/px · 1 of 44 slices shown (9 of 12)]
[im 1/44]
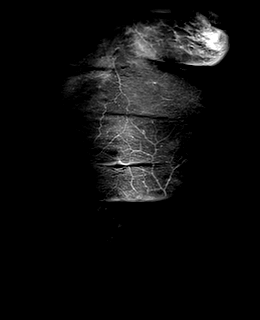

[Series 27: T1 dynamic · coronal · 5.0mm · 1.25mm/px · 1 of 44 slices shown (10 of 12)]
[im 1/44]
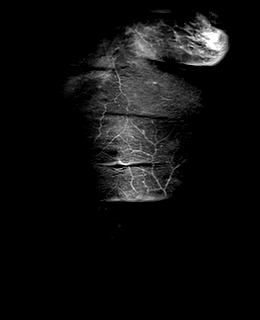

[Series 29: T1 dynamic · axial · 3.0mm · 1.16mm/px · z∈[-188,+49]mm · 2 of 80 slices shown (11 of 12)]
[im 1/80]
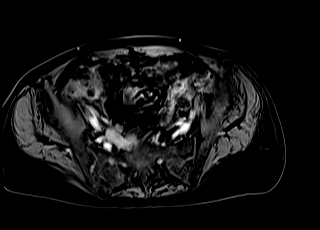
[im 80/80]
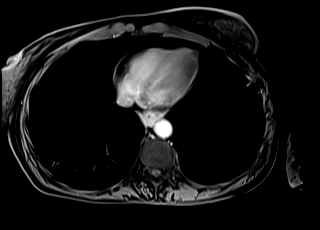

[Series 29: T1 dynamic · axial · 3.0mm · 1.16mm/px · z∈[-188,+49]mm · 2 of 80 slices shown (12 of 12)]
[im 1/80]
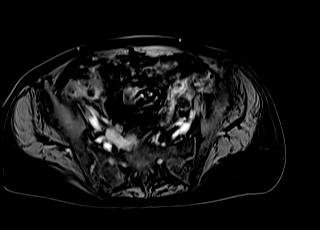
[im 80/80]
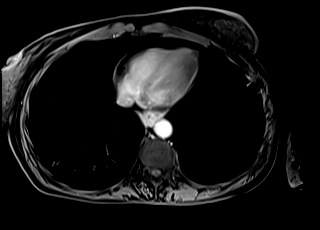

[Series 102: sub_20 sec · axial · 3.0mm · 1.16mm/px · z∈[-188,+49]mm · 2 of 80 slices shown]
[im 1/80]
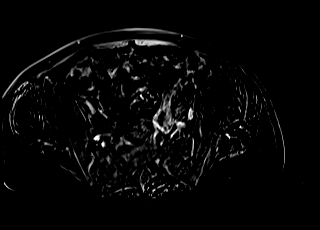
[im 80/80]
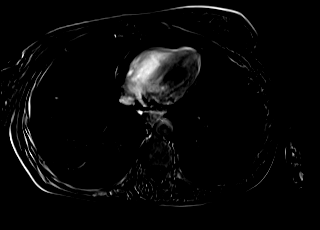

[Series 103: sub_45 sec · axial · 3.0mm · 1.16mm/px · z∈[-188,+49]mm · 2 of 80 slices shown]
[im 1/80]
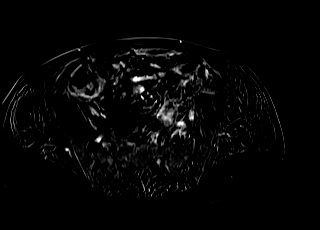
[im 80/80]
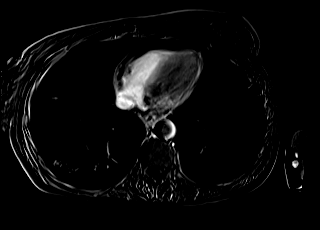

[Series 104: sub_90 sec · axial · 3.0mm · 1.16mm/px · z∈[-188,+49]mm · 2 of 80 slices shown]
[im 1/80]
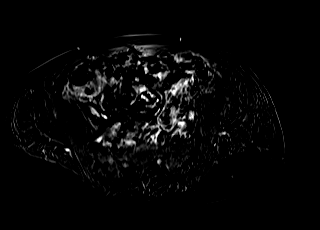
[im 80/80]
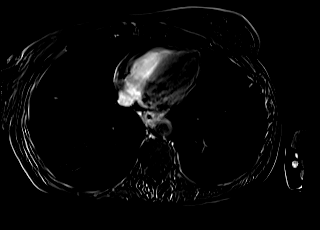

[Series 105: sub_delay · axial · 3.0mm · 1.16mm/px · 1 of 66 slices shown]
[im 1/66]
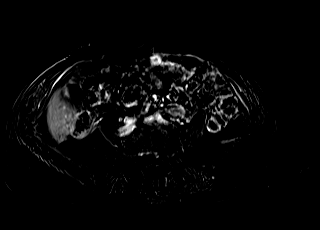

[44 of 48 positions shown; findings below may reference images not displayed]

FINDINGS: Lower chest: Lung bases are clear.

Hepatobiliary: No intrahepatic biliary duct dilatation. The common
hepatic duct measures 10 mm. The common bile duct measures 12 mm.
There is a filling defect within distal common bile duct measuring 3
mm on image 22/series 3.

The distal common bile duct stone is also well seen on the MR TORDESILLA
sequences (series 13)

There multiple gallstones within the gallbladder. No gallbladder
distension.

Pancreas: No pancreatic duct dilatation. No significant
peripancreatic inflammation.

The distal common bile duct stone is well seen on coronal image
[DATE])

Spleen: Normal spleen

Adrenals/urinary tract: Adrenal glands and kidneys normal.

Stomach/Bowel: Stomach, small bowel, appendix, and cecum are normal.
The colon and rectosigmoid colon are normal.

Vascular/Lymphatic: Abdominal aorta is normal caliber. No periportal
or retroperitoneal adenopathy. No pelvic adenopathy.

Other: No free fluid.

Musculoskeletal: No aggressive osseous lesion.
IMPRESSION: 1. Distal common bile duct stone (choledocholithiasis) with moderate
extrahepatic duct dilatation.
2. Cholelithiasis without evidence of acute cholecystitis.
3. No pancreatic duct dilatation or peripancreatic inflammation.

Findings conveyed to Dr. Muhammad Izzat by Dr. Mag on 11 19 1317 at [DATE]
p.m.
